# Patient Record
Sex: Female | Born: 1985
Health system: Southern US, Community
[De-identification: ages and names within clinical notes are randomized; demographics above are authoritative.]

## PROBLEM LIST (undated history)

## (undated) ENCOUNTER — Inpatient Hospital Stay (HOSPITAL_COMMUNITY): Payer: Self-pay

## (undated) DIAGNOSIS — M1990 Unspecified osteoarthritis, unspecified site: Secondary | ICD-10-CM

## (undated) DIAGNOSIS — N200 Calculus of kidney: Secondary | ICD-10-CM

## (undated) DIAGNOSIS — K649 Unspecified hemorrhoids: Secondary | ICD-10-CM

## (undated) DIAGNOSIS — C439 Malignant melanoma of skin, unspecified: Secondary | ICD-10-CM

## (undated) DIAGNOSIS — O139 Gestational [pregnancy-induced] hypertension without significant proteinuria, unspecified trimester: Secondary | ICD-10-CM

## (undated) DIAGNOSIS — D649 Anemia, unspecified: Secondary | ICD-10-CM

## (undated) DIAGNOSIS — I10 Essential (primary) hypertension: Secondary | ICD-10-CM

## (undated) DIAGNOSIS — M1180 Other specified crystal arthropathies, unspecified site: Secondary | ICD-10-CM

## (undated) DIAGNOSIS — R42 Dizziness and giddiness: Secondary | ICD-10-CM

## (undated) DIAGNOSIS — O1490 Unspecified pre-eclampsia, unspecified trimester: Secondary | ICD-10-CM

## (undated) DIAGNOSIS — N390 Urinary tract infection, site not specified: Secondary | ICD-10-CM

## (undated) HISTORY — DX: Calculus of kidney: N20.0

## (undated) HISTORY — PX: MOHS SURGERY: SUR867

## (undated) HISTORY — PX: WISDOM TOOTH EXTRACTION: SHX21

## (undated) HISTORY — DX: Dizziness and giddiness: R42

## (undated) HISTORY — DX: Malignant melanoma of skin, unspecified: C43.9

## (undated) HISTORY — DX: Essential (primary) hypertension: I10

## (undated) HISTORY — DX: Urinary tract infection, site not specified: N39.0

## (undated) HISTORY — DX: Unspecified hemorrhoids: K64.9

## (undated) HISTORY — DX: Unspecified osteoarthritis, unspecified site: M19.90

## (undated) HISTORY — DX: Unspecified pre-eclampsia, unspecified trimester: O14.90

## (undated) HISTORY — PX: NO PAST SURGERIES: SHX2092

## (undated) HISTORY — DX: Anemia, unspecified: D64.9

## (undated) HISTORY — DX: Other specified crystal arthropathies, unspecified site: M11.80

---

## 2009-08-03 ENCOUNTER — Encounter: Admission: RE | Admit: 2009-08-03 | Discharge: 2009-09-08 | Payer: Self-pay | Admitting: Obstetrics and Gynecology

## 2009-09-07 ENCOUNTER — Inpatient Hospital Stay (HOSPITAL_COMMUNITY): Admission: AD | Admit: 2009-09-07 | Discharge: 2009-09-07 | Payer: Self-pay | Admitting: Obstetrics and Gynecology

## 2009-09-07 ENCOUNTER — Other Ambulatory Visit: Payer: Self-pay | Admitting: Emergency Medicine

## 2009-10-10 ENCOUNTER — Inpatient Hospital Stay (HOSPITAL_COMMUNITY): Admission: AD | Admit: 2009-10-10 | Discharge: 2009-10-13 | Payer: Self-pay | Admitting: Obstetrics and Gynecology

## 2009-10-11 DIAGNOSIS — O133 Gestational [pregnancy-induced] hypertension without significant proteinuria, third trimester: Secondary | ICD-10-CM

## 2009-10-14 ENCOUNTER — Encounter: Admission: RE | Admit: 2009-10-14 | Discharge: 2009-11-10 | Payer: Self-pay | Admitting: Obstetrics and Gynecology

## 2009-12-14 ENCOUNTER — Ambulatory Visit: Payer: Self-pay | Admitting: Family Medicine

## 2009-12-14 DIAGNOSIS — J029 Acute pharyngitis, unspecified: Secondary | ICD-10-CM

## 2009-12-14 DIAGNOSIS — J069 Acute upper respiratory infection, unspecified: Secondary | ICD-10-CM | POA: Insufficient documentation

## 2009-12-15 ENCOUNTER — Encounter: Payer: Self-pay | Admitting: Family Medicine

## 2010-06-13 ENCOUNTER — Ambulatory Visit: Payer: Self-pay | Admitting: Emergency Medicine

## 2010-10-11 NOTE — Letter (Signed)
Summary: Out of Work  [facility]  [address]   [place], [place] [address]   Phone: [phone]  Fax: [phone]    December 14, 2009   Employee:  Diana Fischer    To Whom It May Concern:   Diana Fischer was evaluated in our clinic today.  If you need additional information, please feel free to contact our office.         Sincerely,    Donna Christen MD

## 2010-10-11 NOTE — Assessment & Plan Note (Signed)
Summary: possible sinus infection/fever/sore throat   Vital Signs:  Patient Profile:   25 Years Old Female CC:      runny nose, sore thorat, sinus drainage and pressure X 3 days LMP:     01/08/2009 Height:     64 inches Weight:      161 pounds O2 treatment:    Room Air Temp:     99.0 degrees F oral Pulse rate:   96 / minute Resp:     16 per minute (right arm)  Pt. in pain?   no  Vitals Entered By: Lajean Saver RN (December 14, 2009 11:51 AM)  Menstrual History: LMP (date): 01/08/2009 LMP - Reliable: approximate (month known)                   Updated Prior Medication List: MULTIVITAMINS  CAPS (MULTIPLE VITAMIN) once daily ZYRTEC-D ALLERGY & CONGESTION 5-120 MG XR12H-TAB (CETIRIZINE-PSEUDOEPHEDRINE)  DIPHENHYDRAMINE HCL 25 MG CAPS (DIPHENHYDRAMINE HCL)   Current Allergies: ! * SEASONALHistory of Present Illness Chief Complaint: runny nose, sore thorat, sinus drainage and pressure X 3 days History of Present Illness: Subjective: Patient complains of onset of nasal congestion 4 days ago followed by sore throat and cough.  The sore throat has persisted.  She has a history of seasonal allergies.   No pleuritic pain No wheezing + post-nasal drainage No sinus pain/pressure No itchy/red eyes ? earache No hemoptysis No SOB No fever, + chills No nausea No vomiting No abdominal pain No diarrhea No skin rashes + fatigue No myalgias + headache Used OTC meds without relief   REVIEW OF SYSTEMS Constitutional Symptoms      Denies fever, chills, night sweats, weight loss, weight gain, and fatigue.  Eyes       Denies change in vision, eye pain, eye discharge, glasses, contact lenses, and eye surgery. Ear/Nose/Throat/Mouth       Complains of frequent runny nose, sinus problems, and sore throat.      Denies hearing loss/aids, change in hearing, ear pain, ear discharge, dizziness, frequent nose bleeds, hoarseness, and tooth pain or bleeding.  Respiratory       Denies dry  cough, productive cough, wheezing, shortness of breath, asthma, bronchitis, and emphysema/COPD.  Cardiovascular       Denies murmurs, chest pain, and tires easily with exhertion.    Gastrointestinal       Denies stomach pain, nausea/vomiting, diarrhea, constipation, blood in bowel movements, and indigestion. Genitourniary       Denies painful urination, kidney stones, and loss of urinary control. Neurological       Complains of headaches.      Denies paralysis, seizures, and fainting/blackouts. Musculoskeletal       Denies muscle pain, joint pain, joint stiffness, decreased range of motion, redness, swelling, muscle weakness, and gout.  Skin       Denies bruising, unusual mles/lumps or sores, and hair/skin or nail changes.  Psych       Denies mood changes, temper/anger issues, anxiety/stress, speech problems, depression, and sleep problems. Other Comments: Symptoms started Sunday after a trip to the park. She has taken Zyrtec D and Benadryl   Past History:  Past Medical History: Gestational Diabetes Gestational Hypertension 3 months post partum  Past Surgical History: Denies surgical history  Family History: Family History Hypertension- mother  Social History: 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 Alcohol use-no Drug use-no Regular exercise-yes Smoking Status:  never Drug Use:  no Does Patient Exercise:  yes   Objective:  Appearance:  Patient appears healthy, stated age, and in no acute distress  Eyes:  Pupils are equal, round, and reactive to light and accomdation.  Extraocular movement is intact.  Conjunctivae are not inflamed.  Ears:  Canals normal.  Tympanic membranes normal.   Nose:  Normal septum.  Normal turbinates, mildly congested.    No sinus tenderness present.  Pharynx:  Mildly erythematous Neck:  Supple.  No adenopathy is present.   Lungs:  Clear to auscultation.  Breath sounds are equal.  Heart:  Regular rate and rhythm without murmurs, rubs, or  gallops.  Abdomen:  Nontender without masses or hepatosplenomegaly.  Bowel sounds are present.  No CVA or flank tenderness.  Rapid strep test negative                                                                                                                                                                                                                                                                                                                                                                                                     Assessment New Problems: URI (ICD-465.9) ACUTE PHARYNGITIS (ICD-462)  SUSPECT VIRAL URI  Plan New Medications/Changes: AMOXICILLIN 875 MG TABS (AMOXICILLIN) One by mouth two times a day.  Rx void after 12/24/2009.  #20 x 0, 12/14/2009, Donna Christen MD BENZONATATE 200 MG CAPS (BENZONATATE) One by mouth hs as needed cough  #12 x 0, 12/14/2009, Donna Christen MD  New Orders: New Patient Level III [99203] Rapid Strep [87880] T-Culture, Throat [[address]] Planning Comments:   Throat culture pending. expectorant/decongestant, cough suppressant at night Begin  amoxicillin if culture positive or develops persistent facial pain or fever.       Follow-up with PCP if not improving  The patient  and/or caregiver has been counseled thoroughly with regard to medications prescribed including dosage, schedule, interactions, rationale for use, and possible side effects and they verbalize understanding.  Diagnoses and expected course of recovery discussed and will return if not improved as expected or if the condition worsens. Patient and/or caregiver verbalized understanding.  Prescriptions: AMOXICILLIN 875 MG TABS (AMOXICILLIN) One by mouth two times a day.  Rx void after 12/24/2009.  #20 x 0   Entered and Authorized by:   Donna Christen MD   Signed by:   Donna Christen MD on 12/14/2009   Method used:   Print then Give to Patient   RxID:   [id number] BENZONATATE 200 MG CAPS (BENZONATATE) One by mouth hs as needed cough  #12 x 0   Entered and Authorized by:   Donna Christen MD   Signed by:   Donna Christen MD on 12/14/2009   Method used:   Print then Give to Patient   RxID:   [id number]   Patient Instructions: 1)  Stop Benadryl and ZyrtecD for now and take Mucinex D (guaifenesin with decongestant) twice daily for congestion. 2)  Increase fluid intake, rest. 3)  May use Afrin nasal spray (or generic oxymetazoline) twice daily for about 5 days.  Also recommend using saline nasal spray several times daily and/or saline nasal irrigation. 4)  Add amoxicillin if throat culture positive or if persistent fever, facilal pain, etc, develops in about 5 days 5)  Followup with family doctor if not improving 10 to 14 days.  Appended Document: possible sinus infection/fever/sore throat Rapid strep: Negative

## 2010-10-11 NOTE — Letter (Signed)
Summary: Handout Printed  Printed Handout:  - Rheumatic Fever 

## 2010-10-11 NOTE — Assessment & Plan Note (Signed)
Summary: COLD,COUGH/WB   Vital Signs:  Patient Profile:   25 Years Old Female CC:      sore throat, productive cough, sinus pressure Height:     64 inches Weight:      158 pounds O2 Sat:      99 % O2 treatment:    Room Air Temp:     98.8 degrees F oral Pulse rate:   79 / minute Resp:     14 per minute BP sitting:   138 / 86  (left arm) Cuff size:   regular  Vitals Entered By: Lajean Saver RN (June 13, 2010 5:24 PM)                  Updated Prior Medication List: MULTIVITAMINS  CAPS (MULTIPLE VITAMIN) once daily LOESTRIN 24 FE 1-20 MG-MCG TABS (NORETHIN ACE-ETH ESTRAD-FE)   Current Allergies (reviewed today): ! * SEASONALHistory of Present Illness Chief Complaint: sore throat, productive cough, sinus pressure History of Present Illness: Patient complains of onset of cold symptoms for 6 days.  They have been using OTC sudafed PE which is helping a little bit. + sore throat + nighttime cough No pleuritic pain No wheezing + nasal congestion No post-nasal drainage + sinus pain/pressure No itchy/red eyes No earache No hemoptysis No SOB No chills/sweats No fever No nausea No vomiting No abdominal pain No diarrhea No skin rashes No fatigue No myalgias No headache   REVIEW OF SYSTEMS Constitutional Symptoms      Denies fever, chills, night sweats, weight loss, weight gain, and fatigue.  Eyes       Denies change in vision, eye pain, eye discharge, glasses, contact lenses, and eye surgery. Ear/Nose/Throat/Mouth       Complains of sinus problems, sore throat, and hoarseness.      Denies hearing loss/aids, change in hearing, ear pain, ear discharge, dizziness, frequent runny nose, frequent nose bleeds, and tooth pain or bleeding.  Respiratory       Complains of productive cough.      Denies dry cough, wheezing, shortness of breath, asthma, bronchitis, and emphysema/COPD.  Cardiovascular       Denies murmurs, chest pain, and tires easily with exhertion.     Gastrointestinal       Denies stomach pain, nausea/vomiting, diarrhea, constipation, blood in bowel movements, and indigestion. Genitourniary       Denies painful urination, kidney stones, and loss of urinary control. Neurological       Complains of headaches.      Denies paralysis, seizures, and fainting/blackouts. Musculoskeletal       Denies muscle pain, joint pain, joint stiffness, decreased range of motion, redness, swelling, muscle weakness, and gout.  Skin       Denies bruising, unusual mles/lumps or sores, and hair/skin or nail changes.  Psych       Denies mood changes, temper/anger issues, anxiety/stress, speech problems, depression, and sleep problems.  Past History:  Past Medical History: Gestational Diabetes Gestational Hypertension  Past Surgical History: Reviewed history from 12/14/2009 and no changes required. Denies surgical history  Family History: Reviewed history from 12/14/2009 and no changes required. Family History Hypertension- mother  Social History: Reviewed history from 12/14/2009 and no changes required. 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 Alcohol use-no Drug use-no Regular exercise-yes Physical Exam General appearance: well developed, well nourished, no acute distress Head: normocephalic, atraumatic Ears: normal, no lesions or deformities Nasal: mucosa pink, nonedematous, no septal deviation, turbinates normal Oral/Pharynx: pharyngeal erythema without exudate, uvula  midline without deviation Chest/Lungs: no rales, wheezes, or rhonchi bilateral, breath sounds equal without effort Heart: regular rate and  rhythm, no murmur Skin: no obvious rashes or lesions MSE: oriented to time, place, and person Assessment New Problems: UPPER RESPIRATORY INFECTION, ACUTE (ICD-465.9)   Patient Education: Patient and/or caregiver instructed in the following: rest, fluids, Ibuprofen prn.  Plan New Medications/Changes: AMOXICILLIN 875 MG TABS  (AMOXICILLIN) 1 tab by mouth two times a day for 10 days  #20 x 0, 06/13/2010, Hoyt Koch MD  New Orders: Rapid Strep [04540] Est. Patient Level III [99213] Planning Comments:   Continue your OTC cough and cold meds, consider Sudafed 12-hour instead of Sudafed PE Likely viral, but if not better in a few days, can start and then complete the antibiotics   The patient and/or caregiver has been counseled thoroughly with regard to medications prescribed including dosage, schedule, interactions, rationale for use, and possible side effects and they verbalize understanding.  Diagnoses and expected course of recovery discussed and will return if not improved as expected or if the condition worsens. Patient and/or caregiver verbalized understanding.  Prescriptions: AMOXICILLIN 875 MG TABS (AMOXICILLIN) 1 tab by mouth two times a day for 10 days  #20 x 0   Entered and Authorized by:   Hoyt Koch MD   Signed by:   Hoyt Koch MD on 06/13/2010   Method used:   Print then Give to Patient   RxID:   [id number]   Orders Added: 1)  Rapid Strep [08657] 2)  Est. Patient Level III [84696]  Appended Document: COLD,COUGH/WB Rapid Strep:Negative

## 2010-11-04 ENCOUNTER — Ambulatory Visit (INDEPENDENT_AMBULATORY_CARE_PROVIDER_SITE_OTHER): Payer: Self-pay | Admitting: Emergency Medicine

## 2010-11-04 ENCOUNTER — Encounter: Payer: Self-pay | Admitting: Emergency Medicine

## 2010-11-04 DIAGNOSIS — J069 Acute upper respiratory infection, unspecified: Secondary | ICD-10-CM

## 2010-11-08 NOTE — Assessment & Plan Note (Signed)
Summary: SINUS PROBLEMS/TJ   Vital Signs:  Patient Profile:   25 Years Old Female CC:      sinus pain, prod cough, left ear pain x 2 weeks Height:     64 inches Weight:      161 pounds O2 Sat:      98 % O2 treatment:    Room Air Temp:     99.2 degrees F oral Pulse rate:   92 / minute Resp:     16 per minute BP sitting:   152 / 89  (left arm) Cuff size:   regular  Vitals Entered By: Lajean Saver RN (November 04, 2010 5:06 PM)                  Updated Prior Medication List: MULTIVITAMINS  CAPS (MULTIPLE VITAMIN) once daily LOESTRIN 24 FE 1-20 MG-MCG TABS (NORETHIN ACE-ETH ESTRAD-FE)   Current Allergies (reviewed today): ! * SEASONALHistory of Present Illness Chief Complaint: sinus pain, prod cough, left ear pain x 2 weeks History of Present Illness: 25 Years Old Female complains of onset of cold symptoms for 2 weeks. Makaylee has been using Zyrtec-D which is helping a little bit.  Her husband has been sick for 3 weeks. + sore throat + cough No pleuritic pain No wheezing + nasal congestion + post-nasal drainage + sinus pain/pressure No chest congestion No itchy/red eyes + L earache No hemoptysis No SOB No chills/sweats No fever No nausea No vomiting No abdominal pain No diarrhea No skin rashes No fatigue No myalgias No headache   REVIEW OF SYSTEMS Constitutional Symptoms       Complains of fatigue.     Denies fever, chills, night sweats, weight loss, and weight gain.  Eyes       Denies change in vision, eye pain, eye discharge, glasses, contact lenses, and eye surgery. Ear/Nose/Throat/Mouth       Complains of ear pain, frequent runny nose, and sinus problems.      Denies hearing loss/aids, change in hearing, ear discharge, dizziness, frequent nose bleeds, sore throat, hoarseness, and tooth pain or bleeding.  Respiratory       Complains of productive cough.      Denies dry cough, wheezing, shortness of breath, asthma, bronchitis, and emphysema/COPD.    Cardiovascular       Denies murmurs, chest pain, and tires easily with exhertion.    Gastrointestinal       Denies stomach pain, nausea/vomiting, diarrhea, constipation, blood in bowel movements, and indigestion. Genitourniary       Denies painful urination, kidney stones, and loss of urinary control. Neurological       Denies paralysis, seizures, and fainting/blackouts. Musculoskeletal       Denies muscle pain, joint pain, joint stiffness, decreased range of motion, redness, swelling, muscle weakness, and gout.  Skin       Denies bruising, unusual mles/lumps or sores, and hair/skin or nail changes.  Psych       Denies mood changes, temper/anger issues, anxiety/stress, speech problems, depression, and sleep problems.  Past History:  Past Medical History: Reviewed history from 06/13/2010 and no changes required. Gestational Diabetes Gestational Hypertension  Past Surgical History: Reviewed history from 12/14/2009 and no changes required. Denies surgical history  Family History: Reviewed history from 12/14/2009 and no changes required. Family History Hypertension- mother  Social History: Reviewed history from 12/14/2009 and no changes required. 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 Alcohol use-no Drug use-no Regular exercise-yes Physical Exam General appearance: well developed, well  nourished, no acute distress Ears: normal, no lesions or deformities Nasal: mucosa pink, nonedematous, no septal deviation, turbinates normal Oral/Pharynx: tongue normal, posterior pharynx without erythema or exudate Chest/Lungs: no rales, wheezes, or rhonchi bilateral, breath sounds equal without effort Heart: regular rate and  rhythm, no murmur MSE: oriented to time, place, and person  Plan New Medications/Changes: AMOXICILLIN 875 MG TABS (AMOXICILLIN) 1 by mouth two times a day for 10 days  #20 x 0, 11/04/2010, Hoyt Koch MD  New Orders: Est. Patient Level IV  [[address]] Planning Comments:   1)  Take the prescribed antibiotic as instructed. 2)  Use nasal saline solution (over the counter) at least 3 times a day. 3)  Use over the counter decongestants like Zyrtec-D every 12 hours as needed to help with congestion. 4)  Can take tylenol every 6 hours or motrin every 8 hours for pain or fever. 5)  Follow up with your primary doctor  if no improvement in 5-7 days, sooner if increasing pain, fever, or new symptoms.    The patient and/or caregiver has been counseled thoroughly with regard to medications prescribed including dosage, schedule, interactions, rationale for use, and possible side effects and they verbalize understanding.  Diagnoses and expected course of recovery discussed and will return if not improved as expected or if the condition worsens. Patient and/or caregiver verbalized understanding.  Prescriptions: AMOXICILLIN 875 MG TABS (AMOXICILLIN) 1 by mouth two times a day for 10 days  #20 x 0   Entered and Authorized by:   Hoyt Koch MD   Signed by:   Hoyt Koch MD on 11/04/2010   Method used:   Print then Give to Patient   RxID:   [id number]   Orders Added: 1)  Est. Patient Level IV [95621]

## 2010-11-28 LAB — COMPREHENSIVE METABOLIC PANEL
ALT: 14 U/L (ref 0–35)
Albumin: 2.5 g/dL — ABNORMAL LOW (ref 3.5–5.2)
Alkaline Phosphatase: 146 U/L — ABNORMAL HIGH (ref 39–117)
Potassium: 3.6 mEq/L (ref 3.5–5.1)
Sodium: 140 mEq/L (ref 135–145)
Total Protein: 5.9 g/dL — ABNORMAL LOW (ref 6.0–8.3)

## 2010-11-28 LAB — CBC
Hemoglobin: 12.1 g/dL (ref 12.0–15.0)
MCV: 93.8 fL (ref 78.0–100.0)
Platelets: 240 10*3/uL (ref 150–400)
Platelets: 260 10*3/uL (ref 150–400)
RDW: 13.5 % (ref 11.5–15.5)
WBC: 13.2 10*3/uL — ABNORMAL HIGH (ref 4.0–10.5)
WBC: 14.1 10*3/uL — ABNORMAL HIGH (ref 4.0–10.5)

## 2010-11-28 LAB — RPR: RPR Ser Ql: NONREACTIVE

## 2010-12-01 LAB — CBC
HCT: 27.4 % — ABNORMAL LOW (ref 36.0–46.0)
Hemoglobin: 9.4 g/dL — ABNORMAL LOW (ref 12.0–15.0)
MCHC: 34.2 g/dL (ref 30.0–36.0)
MCV: 94.7 fL (ref 78.0–100.0)
RDW: 13.7 % (ref 11.5–15.5)

## 2010-12-12 LAB — CBC
Hemoglobin: 11.8 g/dL — ABNORMAL LOW (ref 12.0–15.0)
MCHC: 34.2 g/dL (ref 30.0–36.0)
MCV: 94 fL (ref 78.0–100.0)
RBC: 3.67 MIL/uL — ABNORMAL LOW (ref 3.87–5.11)
WBC: 12.1 10*3/uL — ABNORMAL HIGH (ref 4.0–10.5)

## 2010-12-12 LAB — COMPREHENSIVE METABOLIC PANEL
ALT: 18 U/L (ref 0–35)
CO2: 25 mEq/L (ref 19–32)
Calcium: 8.8 mg/dL (ref 8.4–10.5)
Chloride: 105 mEq/L (ref 96–112)
Creatinine, Ser: 0.47 mg/dL (ref 0.4–1.2)
GFR calc non Af Amer: >60 mL/min (ref >60)
Glucose, Bld: 82 mg/dL (ref 70–99)
Total Bilirubin: 0.3 mg/dL (ref 0.3–1.2)

## 2010-12-12 LAB — LACTATE DEHYDROGENASE: LDH: 150 U/L (ref 94–250)

## 2011-02-23 ENCOUNTER — Inpatient Hospital Stay (INDEPENDENT_AMBULATORY_CARE_PROVIDER_SITE_OTHER)
Admission: RE | Admit: 2011-02-23 | Discharge: 2011-02-23 | Disposition: A | Discharge status: Home / Self Care | Payer: 59 | Source: Ambulatory Visit | Attending: Emergency Medicine | Admitting: Emergency Medicine

## 2011-02-23 ENCOUNTER — Encounter: Payer: Self-pay | Admitting: Emergency Medicine

## 2011-02-23 DIAGNOSIS — R109 Unspecified abdominal pain: Secondary | ICD-10-CM

## 2011-02-23 LAB — CONVERTED CEMR LAB
Beta hcg, urine, semiquantitative: NEGATIVE
Bilirubin Urine: NEGATIVE
Blood in Urine, dipstick: NEGATIVE
Glucose, Urine, Semiquant: NEGATIVE
Ketones, urine, test strip: NEGATIVE
Nitrite: NEGATIVE
Protein, U semiquant: NEGATIVE
Specific Gravity, Urine: 1.025
Urobilinogen, UA: 0.2
WBC Urine, dipstick: NEGATIVE
pH: 5.5

## 2011-02-28 ENCOUNTER — Telehealth (INDEPENDENT_AMBULATORY_CARE_PROVIDER_SITE_OTHER): Payer: Self-pay | Admitting: Emergency Medicine

## 2011-08-14 NOTE — Progress Notes (Signed)
Summary: LIGHT HEADED/NAUSEA/PELVIC CRAMPS   Vital Signs:  Patient Profile:   25 Years Old Female CC:      pelvic cramping, lower back pain, nausea x 3 days, htn this AM LMP:     02/09/2011 Height:     64 inches Weight:      160 pounds O2 Sat:      99 % O2 treatment:    Room Air Temp:     98.9 degrees F oral Pulse rate:   74 / minute Resp:     14 per minute BP sitting:   136 / 93  (left arm) Cuff size:   regular  Vitals Entered By: Lajean Saver RN (February 23, 2011 10:49 AM)  Menstrual History: LMP (date): 02/09/2011                  Updated Prior Medication List: MULTIVITAMINS  CAPS (MULTIPLE VITAMIN) once daily ZYRTEC ALLERGY 10 MG TABS (CETIRIZINE HCL)   Current Allergies (reviewed today): ! * SEASONALHistory of Present Illness History from: patient Chief Complaint: pelvic cramping, lower back pain, nausea x 3 days, htn this AM History of Present Illness: Pt complains of pelvic pain. Location:suprapubic  Onset:3 days ago  Description/Quality of Pain: crampy, feels like "midcycle pain" Intensity of pain: 4/10 Modifying Factors: Has not tried pain med. Trauma: None Symptoms Worse with:n/a Better with: nothing LMP was wnl 02/09/11. Associated sxs: mild low back pain without radiation. Thick, clear vag d/c which she states is normal for her midcycle. Mild nausea, no vomiting or diarrhea or change in bowel habits. No dysuria. She is monogomous with husband and denies any chance of STD, and declines pelvic exam or std testing. She was at work this am when the pelvic cramping increased, and she briefly became lightheaded, "saw spots" for 2 seconds, but that resolved. No syncope or focal neuro sxs. Her BP was checked at work, was 146/93. Hx of pregnancy induced HTN, which resolved when she delivered 16 months ago. Denies chest pain, palpitations, dyspnea. Had moderate headache which is easing up now.  REVIEW OF SYSTEMS Constitutional Symptoms      Denies fever, chills,  night sweats, weight loss, weight gain, and fatigue.  Eyes       Denies change in vision, eye pain, eye discharge, glasses, contact lenses, and eye surgery. Ear/Nose/Throat/Mouth       Denies hearing loss/aids, change in hearing, ear pain, ear discharge, dizziness, frequent runny nose, frequent nose bleeds, sinus problems, sore throat, hoarseness, and tooth pain or bleeding.  Respiratory       Denies dry cough, productive cough, wheezing, shortness of breath, asthma, bronchitis, and emphysema/COPD.  Cardiovascular       Denies murmurs, chest pain, and tires easily with exhertion.    Gastrointestinal       Complains of nausea/vomiting.      Denies stomach pain, diarrhea, constipation, blood in bowel movements, and indigestion.      Comments: nausea only Genitourniary       Denies painful urination, blood or discharge from vagina, kidney stones, and loss of urinary control. Neurological       Complains of headaches.      Denies paralysis, seizures, and fainting/blackouts. Musculoskeletal       Denies muscle pain, joint pain, joint stiffness, decreased range of motion, redness, swelling, muscle weakness, and gout.      Comments: lower back pain Skin       Denies bruising, unusual mles/lumps or sores, and hair/skin or nail  changes.  Psych       Denies mood changes, temper/anger issues, anxiety/stress, speech problems, depression, and sleep problems. Other Comments: Patient c/o nausea, pelvic cramping, lower back pain and slight increase in discharge x 3 days. This AM she became ligthheaded and "saw spots", upon taking her BP it was 146/93. It has since dropped to 136/93. She does not have a PCP. [facility] card given.    Past History:  Past Medical History: Reviewed history from 06/13/2010 and no changes required. Gestational Diabetes Gestational Hypertension  Past Surgical History: Reviewed history from 12/14/2009 and no changes required. Denies surgical history  Family  History: Reviewed history from 12/14/2009 and no changes required. Family History Hypertension- mother  Social History: Reviewed history from 12/14/2009 and no changes required. 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 Alcohol use-no Drug use-no Regular exercise-yes Physical Exam General appearance: well developed, well nourished, no acute distress. Mildly fatigued. Not toxic-appearing. Head: normocephalic, atraumatic Eyes: conjunctivae and lids normal Pupils: equal, round, reactive to light Ears: normal, no lesions or deformities Nasal: mucosa pink, nonedematous, no septal deviation, turbinates normal. Minimal seous nasal drainage. Oral/Pharynx: tongue normal, posterior pharynx without erythema or exudate Neck: neck supple,  trachea midline, no masses. Carotids nl without bruits. Thyroid: no nodules, masses, tenderness, or enlargement Chest/Lungs: no rales, wheezes, or rhonchi bilateral, breath sounds equal without effort Heart: regular rate and  rhythm, no murmur Abdomen: soft, mild suprapubic tenderness. without obvious organomegaly or masses.No flank ttp. No guarding or rebound. Normoactive BS's x 4 GU: pt declined pelvic or rectal exam Extremities: normal extremities Neurological: grossly intact and non-focal. Normal gait. Back: no cvat Skin: no obvious rashes or lesions MSE: oriented to time, place, and person BP repeated by me: 120/78 R arm Assessment New Problems: PELVIC  PAIN (ICD-789.09)  Likely midcycle pelvic pain. UA and urine pregnancy tests today: Negative. CBC: WNL. Hgb normal. Absolute granulocyte count WNL. Clinically, no evidence of acute abdomen. BP may have been elevated this am from acute pelvic pain. No evidence of any acute cardioresp or neurological event.  Plan New Orders: Est. Patient Level IV [04540] Urine Pregnancy Test  [81025] UA Dipstick w/o Micro (automated)  [81003] T-CBC w/Diff [[id number]] Planning Comments:   Pt declined any  further testing. Declined any pain rx. Addvised to take otc ibuprofen, 200mg , 3 by mouth three times a day pc. Risks, benefits, alternatives discussed. Pt voiced understanding and agreement.  Follow Up: with her ob-gyn if no better in 2-3 days, sooner if worse or new sxs. Go to ER stat if any severe, worsening sxs.---Also, advised to establish with a family physician, info and card given Work Excuse: excused from work today  The patient and/or caregiver has been counseled thoroughly with regard to medications prescribed including dosage, schedule, interactions, rationale for use, and possible side effects and they verbalize understanding.  Diagnoses and expected course of recovery discussed and will return if not improved as expected or if the condition worsens. Patient and/or caregiver verbalized understanding.   Orders Added: 1)  Est. Patient Level IV [95621] 2)  Urine Pregnancy Test  [81025] 3)  UA Dipstick w/o Micro (automated)  [81003] 4)  T-CBC w/Diff [[id number]]    Laboratory Results   Urine Tests  Date/Time Received: February 23, 2011 11:18 AM  Date/Time Reported: February 23, 2011 11:18 AM   Routine Urinalysis   Color: yellow Appearance: Clear Glucose: negative   (Normal Range: Negative) Bilirubin: negative   (Normal Range: Negative) Ketone: negative   (  Normal Range: Negative) Spec. Gravity: 1.025   (Normal Range: 1.003-1.035) Blood: negative   (Normal Range: Negative) pH: 5.5   (Normal Range: 5.0-8.0) Protein: negative   (Normal Range: Negative) Urobilinogen: 0.2   (Normal Range: 0-1) Nitrite: negative   (Normal Range: Negative) Leukocyte Esterace: negative   (Normal Range: Negative)    Urine HCG: negative

## 2011-08-14 NOTE — Telephone Encounter (Signed)
  Phone Note Outgoing Call Call back at [facility] Phone [phone]   Call placed by: Emilio Math,  February 28, 2011 9:50 AM Call placed to: Patient Summary of Call: left msg, call with any questions or concerns

## 2012-05-05 ENCOUNTER — Emergency Department
Admission: EM | Admit: 2012-05-05 | Discharge: 2012-05-05 | Disposition: A | Discharge status: Home / Self Care | Payer: Self-pay | Source: Home / Self Care

## 2012-05-05 DIAGNOSIS — S61209A Unspecified open wound of unspecified finger without damage to nail, initial encounter: Secondary | ICD-10-CM

## 2012-05-05 DIAGNOSIS — S61219A Laceration without foreign body of unspecified finger without damage to nail, initial encounter: Secondary | ICD-10-CM

## 2012-05-05 NOTE — ED Provider Notes (Signed)
History     CSN: [id number]  Arrival date & time 05/05/12  1640   First MD Initiated Contact with Patient 05/05/12 1746      No chief complaint on file.  HPI Comments: Has remained with full ROM, finger flexion and extension.  Neurovascularly intact distally.   Patient is a 26 y.o. female presenting with skin laceration.  Laceration  The incident occurred less than 1 hour ago (pt cut her hand accidentally with hedge clippers ). Pain location: R 3rd finger  Injury mechanism: hedge clippers  The pain is mild. She reports no foreign bodies present. Her tetanus status is UTD.    No past medical history on file.  No past surgical history on file.  No family history on file.  History  Substance Use Topics  . Smoking status: Not on file  . Smokeless tobacco: Not on file  . Alcohol Use: Not on file    OB History    No data available      Review of Systems  All other systems reviewed and are negative.    Allergies  Review of patient's allergies indicates not on file.  Home Medications  No current outpatient prescriptions on file.  There were no vitals taken for this visit.  Physical Exam  Constitutional: She appears well-developed and well-nourished.  HENT:  Head: Normocephalic and atraumatic.  Eyes: Conjunctivae are normal. Pupils are equal, round, and reactive to light.  Neck: Normal range of motion. Neck supple.  Cardiovascular: Normal rate and regular rhythm.   Pulmonary/Chest: Effort normal and breath sounds normal.  Abdominal: Soft.  Musculoskeletal: Normal range of motion.       Hands:         Neurological: She is alert.  Skin:       2 1.5 cm longitudinal lacerations on palmar aspect of R 3rd finger.  Midline laceration does proceed approx 0.25 cm into cuticle.      ED Course  Procedures  Laceration Repair: Area soaked in chlorhexidine then manually cleansed with sterile water. Area then sterilized with topical betadine and alcohol swabs. 2%  lidocaine applied to affected area. Three sutures placed in interrupted fashion (5-0 ethilon). dermabond applied to most medial laceration.  Cuticle trimmed over affected area. No subungual involvement. Area cleansed. Topical antibiotic ointment and dressing applied.  Labs Reviewed - No data to display No results found.   1. Finger laceration       MDM  Sutures and dermabond applied to affected areas Discussed general care and infectious red flags.  Would consider wound culture and trial of augmentin vs. Doxy  Plan for follow up in 5-7 days for suture removal.      The patient and/or caregiver has been counseled thoroughly with regard to treatment plan and/or medications prescribed including dosage, schedule, interactions, rationale for use, and possible side effects and they verbalize understanding. Diagnoses and expected course of recovery discussed and will return if not improved as expected or if the condition worsens. Patient and/or caregiver verbalized understanding.              Doree Albee, MD 05/05/12 [phone]

## 2012-06-10 ENCOUNTER — Encounter: Payer: Self-pay | Admitting: *Deleted

## 2012-06-10 ENCOUNTER — Emergency Department
Admission: EM | Admit: 2012-06-10 | Discharge: 2012-06-10 | Disposition: A | Discharge status: Home / Self Care | Payer: Self-pay | Source: Home / Self Care

## 2012-06-10 ENCOUNTER — Other Ambulatory Visit: Payer: Self-pay | Admitting: Family Medicine

## 2012-06-10 ENCOUNTER — Ambulatory Visit (HOSPITAL_BASED_OUTPATIENT_CLINIC_OR_DEPARTMENT_OTHER)
Admission: RE | Admit: 2012-06-10 | Discharge: 2012-06-10 | Disposition: A | Discharge status: Home / Self Care | Payer: Self-pay | Source: Ambulatory Visit | Attending: Family Medicine | Admitting: Family Medicine

## 2012-06-10 DIAGNOSIS — N83209 Unspecified ovarian cyst, unspecified side: Secondary | ICD-10-CM | POA: Insufficient documentation

## 2012-06-10 DIAGNOSIS — R109 Unspecified abdominal pain: Secondary | ICD-10-CM | POA: Insufficient documentation

## 2012-06-10 DIAGNOSIS — R350 Frequency of micturition: Secondary | ICD-10-CM | POA: Insufficient documentation

## 2012-06-10 DIAGNOSIS — R110 Nausea: Secondary | ICD-10-CM | POA: Insufficient documentation

## 2012-06-10 DIAGNOSIS — N949 Unspecified condition associated with female genital organs and menstrual cycle: Secondary | ICD-10-CM

## 2012-06-10 DIAGNOSIS — R102 Pelvic and perineal pain: Secondary | ICD-10-CM

## 2012-06-10 HISTORY — DX: Gestational (pregnancy-induced) hypertension without significant proteinuria, unspecified trimester: O13.9

## 2012-06-10 LAB — POCT URINALYSIS DIP (MANUAL ENTRY)
Bilirubin, UA: NEGATIVE
Glucose, UA: NEGATIVE
Nitrite, UA: NEGATIVE
Spec Grav, UA: 1.015 (ref 1.005–1.03)
pH, UA: 5.5 (ref 5–8)

## 2012-06-10 LAB — COMPREHENSIVE METABOLIC PANEL
Albumin: 4.7 g/dL (ref 3.5–5.2)
BUN: 7 mg/dL (ref 6–23)
CO2: 25 mEq/L (ref 19–32)
Calcium: 9.7 mg/dL (ref 8.4–10.5)
Chloride: 104 mEq/L (ref 96–112)
Glucose, Bld: 83 mg/dL (ref 70–99)
Potassium: 4.5 mEq/L (ref 3.5–5.3)
Sodium: 139 mEq/L (ref 135–145)
Total Protein: 7.6 g/dL (ref 6.0–8.3)

## 2012-06-10 MED ORDER — IOHEXOL 300 MG/ML  SOLN
100.0000 mL | Freq: Once | INTRAMUSCULAR | Status: AC | PRN
  Administered 2012-06-10: 100 mL via INTRAVENOUS

## 2012-06-10 NOTE — ED Notes (Signed)
Pt c/o LT sided pelvic pain and urinary frequency x 2 days. She also c/o hypertension.

## 2012-06-10 NOTE — ED Provider Notes (Addendum)
History     CSN: [id number]  Arrival date & time 06/10/12  0915   First MD Initiated Contact with Patient 06/10/12 [phone]      Chief Complaint  Patient presents with  . Pelvic Pain  . Hypertension   HPI Comments: Pt states that she started her cycle last week. Had otherwise normal cycle on 1st and 2nd day. Then developed severe LLQ pain as well as cessation of menstrual cycle over the weekend.  Pt states that pain persisted through the weekend into today.  Pt states that pain was 8-9/10 over the weekend and has mildly improved as of today.  Pt states that this has never happened before. Pain has been predominantly L sided without radiation.  Pt states that she has had some increased urinary frequency associated with abdominal pain. No dysuria. No hx/o UTIs. No hx/o kidney stones.  No fevers or chills.  Pt is sexually active with her husband. Is currently not on birth control. No prior hx/o STDs. No vaginal discharge apart from now intermittent spotting.  Pt does report a hx/o gestational HTN with her last pregnancy 3 years ago. Pt states that she has been off of HTN medication since end of pregnancy. Pt states that she    Patient is a 26 y.o. female presenting with pelvic pain and hypertension.  Pelvic Pain This is a new problem. Episode onset: 2-3 days  Progression since onset: initially worsened now somewhat milder.   Hypertension    Past Medical History  Diagnosis Date  . Gestational diabetes   . Gestational hypertension     History reviewed. No pertinent past surgical history.  Family History  Problem Relation Age of Onset  . Hypertension Mother     History  Substance Use Topics  . Smoking status: Never Smoker   . Smokeless tobacco: Not on file  . Alcohol Use: No    OB History    Grav Para Term Preterm Abortions TAB SAB Ect Mult Living                  Review of Systems  Genitourinary: Positive for pelvic pain.  All other systems reviewed and are  negative.    Allergies  Review of patient's allergies indicates no known allergies.  Home Medications  No current outpatient prescriptions on file.  BP 134/95  Pulse 77  Temp 98.6 F (37 C) (Oral)  Resp 18  Ht 5' 3.5" (1.613 m)  Wt 176 lb (79.833 kg)  BMI 30.69 kg/m2  SpO2 99%  LMP 06/06/2012  Physical Exam  Vitals reviewed. Constitutional: She appears well-developed and well-nourished.  HENT:  Head: Normocephalic and atraumatic.  Eyes: Conjunctivae normal are normal. Pupils are equal, round, and reactive to light.  Neck: Normal range of motion. Neck supple.  Cardiovascular: Normal rate, regular rhythm and normal heart sounds.   Pulmonary/Chest: Effort normal and breath sounds normal.  Abdominal: Soft. Bowel sounds are normal.       + RLQ and LLQ pain  Pain 7-8/10 on deep palpation bilaterally   Musculoskeletal: Normal range of motion.  Neurological: She is alert.  Skin: Skin is warm.    ED Course  Procedures (including critical care time)   Labs Reviewed  POCT URINALYSIS DIP (MANUAL ENTRY)  POCT URINE PREGNANCY  POCT CBC W AUTO DIFF ([facility])  COMPREHENSIVE METABOLIC PANEL  URINE CULTURE   No results found.   1. Pelvic pain in female       MDM  Relatively  broad differential for pain including GYN/GU/GI pathologies.  Given RLQ pain, will set up pt for CT Abd and Pelvis with IV and oral contrast to r/o appendicitis. This should also give good imaging for ? Ovarian cyst as well as fibroids.  Upreg negative.  UA with moderate blood. Will culture.  Noted WBC 10.8 on CBC today.  Discussed general reasoning for lab work and imaging.  Pt is overall stable currently without any clear clinical indications for ER transfer.  Will follow pending imaging.           Doree Albee, MD 06/10/12 1051  Doree Albee, MD 06/11/12 [phone]

## 2012-06-11 LAB — POCT CBC W AUTO DIFF (K'VILLE URGENT CARE)

## 2012-06-12 LAB — URINE CULTURE: Colony Count: 40000

## 2012-06-13 ENCOUNTER — Telehealth: Payer: Self-pay | Admitting: *Deleted

## 2012-06-18 ENCOUNTER — Encounter: Payer: Self-pay | Admitting: Obstetrics & Gynecology

## 2013-04-24 LAB — OB RESULTS CONSOLE ANTIBODY SCREEN: Antibody Screen: NEGATIVE

## 2013-04-24 LAB — OB RESULTS CONSOLE RPR: RPR: NONREACTIVE

## 2013-04-24 LAB — OB RESULTS CONSOLE GC/CHLAMYDIA
Chlamydia: NEGATIVE
Gonorrhea: NEGATIVE

## 2013-04-24 LAB — OB RESULTS CONSOLE HEPATITIS B SURFACE ANTIGEN: HEP B S AG: NEGATIVE

## 2013-04-24 LAB — OB RESULTS CONSOLE RUBELLA ANTIBODY, IGM: Rubella: IMMUNE

## 2013-04-24 LAB — OB RESULTS CONSOLE ABO/RH: RH TYPE: POSITIVE

## 2013-04-24 LAB — OB RESULTS CONSOLE HIV ANTIBODY (ROUTINE TESTING): HIV: NONREACTIVE

## 2013-08-09 ENCOUNTER — Encounter: Payer: Self-pay | Admitting: Emergency Medicine

## 2013-08-09 ENCOUNTER — Emergency Department
Admission: EM | Admit: 2013-08-09 | Discharge: 2013-08-09 | Disposition: A | Discharge status: Home / Self Care | Payer: 59 | Source: Home / Self Care

## 2013-08-09 DIAGNOSIS — J069 Acute upper respiratory infection, unspecified: Secondary | ICD-10-CM

## 2013-08-09 MED ORDER — AMOXICILLIN 875 MG PO TABS: 875.0000 mg | ORAL_TABLET | Freq: Two times a day (BID) | ORAL | Status: DC

## 2013-08-09 NOTE — ED Notes (Signed)
Patient c/o sinus pressure, sore throat, nasal congestion and head ache x 3-4 days. Patient is currently pregnant and has tried OTC Tylenol sinus and cold am/pm with some relief.

## 2013-08-09 NOTE — ED Notes (Signed)
Symptoms started one week ago with head and chest congestion, c/o difficulty sleeping

## 2013-08-09 NOTE — ED Provider Notes (Signed)
CSN: [id number]     Arrival date & time 08/09/13  1005 History   None    Chief Complaint  Patient presents with  . Sinusitis    HPI  URI Symptoms Onset: 2-3 days  Description: rhinorrhea, nasal congestion, cough  Modifying factors:  6 months pregnant. Previous dx of gestational DM and HTN with her last pregnancy. Has normal pregnancy this gestation per pt.   Symptoms Nasal discharge: yes Fever: no Sore throat: no Cough: yes Wheezing: no Ear pain: no GI symptoms: no Sick contacts: no  Red Flags  Stiff neck: no Dyspnea: no Rash: no Swallowing difficulty: no  Sinusitis Risk Factors Headache/face pain: no Double sickening: no tooth pain: no  Allergy Risk Factors Sneezing: no Itchy scratchy throat: no Seasonal symptoms: no  Flu Risk Factors Headache: no muscle aches: no severe fatigue: no    Past Medical History  Diagnosis Date  . Gestational diabetes   . Gestational hypertension    History reviewed. No pertinent past surgical history. Family History  Problem Relation Age of Onset  . Hypertension Mother    History  Substance Use Topics  . Smoking status: Never Smoker   . Smokeless tobacco: Not on file  . Alcohol Use: No   OB History   Grav Para Term Preterm Abortions TAB SAB Ect Mult Living   1              Review of Systems  All other systems reviewed and are negative.    Allergies  Review of patient's allergies indicates no known allergies.  Home Medications   Current Outpatient Rx  Name  Route  Sig  Dispense  Refill  . amoxicillin (AMOXIL) 875 MG tablet   Oral   Take 1 tablet (875 mg total) by mouth 2 (two) times daily.   20 tablet   0    BP 115/78  Pulse 85  Temp(Src) 98.5 F (36.9 C) (Oral)  Resp 16  Ht 5' 3.5" (1.613 m)  Wt 183 lb (83.008 kg)  BMI 31.90 kg/m2  SpO2 99% Physical Exam  Constitutional: She appears well-developed and well-nourished.  HENT:  Head: Normocephalic and atraumatic.  Eyes: Conjunctivae are  normal. Pupils are equal, round, and reactive to light.  Neck: Normal range of motion. Neck supple.  Cardiovascular: Normal rate and regular rhythm.   Pulmonary/Chest: Effort normal.  Abdominal: Soft.  Gravid abdomen    Musculoskeletal: Normal range of motion.  Neurological: She is alert.  Skin: Skin is warm.    ED Course  Procedures (including critical care time) Labs Review Labs Reviewed - No data to display Imaging Review No results found.  EKG Interpretation    Date/Time:    Ventricular Rate:    PR Interval:    QRS Duration:   QT Interval:    QTC Calculation:   R Axis:     Text Interpretation:              MDM   1. URI (upper respiratory infection)    Suspect likely viral source of sxs Discussed supportive care and infectious/resp red flags Also discussed OB red flags.  PPx rx for amox if worsen/fail to improve over next 7-10 days.  Follow up as needed.     Doree Albee, MD 08/09/13 [phone]

## 2013-09-11 NOTE — L&D Delivery Note (Signed)
Delivery Note  SVD viable female Apgars 9,9 over 2nd degree ml lac.  Placenta delivered spontaneously intact with 3VC. Repair with 2-0 Chromic with good support and hemostasis noted and R/V exam confirms.  PH art was sent.  [place] cord blood was not done.  Mother and baby were doing well.  EBL 300cc  Louretta Shorten, MD

## 2013-10-21 ENCOUNTER — Encounter: Payer: Self-pay | Admitting: Emergency Medicine

## 2013-10-21 ENCOUNTER — Emergency Department
Admission: EM | Admit: 2013-10-21 | Discharge: 2013-10-21 | Disposition: A | Discharge status: Home / Self Care | Payer: 59 | Source: Home / Self Care | Attending: Emergency Medicine | Admitting: Emergency Medicine

## 2013-10-21 DIAGNOSIS — J029 Acute pharyngitis, unspecified: Secondary | ICD-10-CM

## 2013-10-21 LAB — POCT RAPID STREP A (OFFICE): Rapid Strep A Screen: NEGATIVE

## 2013-10-21 MED ORDER — AMOXICILLIN 875 MG PO TABS: ORAL_TABLET | ORAL | Status: DC

## 2013-10-21 NOTE — ED Notes (Signed)
Pt c/o sore throat radiating to her RT ear x 3 days. Denies fever. She reports a family member with strep recently.

## 2013-10-21 NOTE — ED Provider Notes (Signed)
CSN: [id number]     Arrival date & time 10/21/13  1255 History   First MD Initiated Contact with Patient 10/21/13 1308     Chief Complaint  Patient presents with  . Sore Throat  . Otalgia     (Consider location/radiation/quality/duration/timing/severity/associated sxs/prior Treatment) HPI SORE THROAT Onset: 3 days    Severity: moderate-severe Tried OTC meds without significant relief.  Symptoms:  + Fever  + Swollen neck glands No Recent Strep Exposure     No Myalgias No Headache No Rash  No Discolored Nasal Mucus No Allergy symptoms No sinus pain/pressure No itchy/red eyes Positive mild bilateral earache  No Drooling No Trismus  No Nausea No Vomiting No Abdominal pain No Diarrhea No Reflux symptoms  No Cough No Breathing Difficulty No Shortness of Breath No pleuritic pain No Wheezing No Hemoptysis  Positive for mild fatigue   Past Medical History  Diagnosis Date  . Gestational diabetes   . Gestational hypertension    History reviewed. No pertinent past surgical history. Family History  Problem Relation Age of Onset  . Hypertension Mother    History  Substance Use Topics  . Smoking status: Never Smoker   . Smokeless tobacco: Not on file  . Alcohol Use: No   OB History   Grav Para Term Preterm Abortions TAB SAB Ect Mult Living   1              Review of Systems  All other systems reviewed and are negative.      Allergies  Review of patient's allergies indicates no known allergies.  Home Medications   Current Outpatient Rx  Name  Route  Sig  Dispense  Refill  . Prenatal Vit-Fe Fumarate-FA (PRENATAL MULTIVITAMIN) TABS tablet   Oral   Take 1 tablet by mouth daily at 12 noon.         Marland Kitchen amoxicillin (AMOXIL) 875 MG tablet      Take 1 twice a day X 10 days.   20 tablet   0    BP 121/87  Pulse 97  Temp(Src) 98.6 F (37 C) (Oral)  Resp 18  Wt 189 lb (85.73 kg)  SpO2 98% Physical Exam  Nursing note and vitals  reviewed. Constitutional: She is oriented to person, place, and time. She appears well-developed and well-nourished. She is cooperative.  Non-toxic appearance. No distress.  HENT:  Head: Normocephalic and atraumatic.  Right Ear: Tympanic membrane, external ear and ear canal normal.  Left Ear: Tympanic membrane, external ear and ear canal normal.  Nose: Nose normal. Right sinus exhibits no maxillary sinus tenderness and no frontal sinus tenderness. Left sinus exhibits no maxillary sinus tenderness and no frontal sinus tenderness.  Mouth/Throat: Mucous membranes are normal. Posterior oropharyngeal erythema present. No oropharyngeal exudate or posterior oropharyngeal edema.  Eyes: Conjunctivae are normal. No scleral icterus.  Neck: Neck supple.  Cardiovascular: Normal rate, regular rhythm and normal heart sounds.   No murmur heard. Pulmonary/Chest: Effort normal and breath sounds normal. No stridor. No respiratory distress. She has no wheezes. She has no rales.  Musculoskeletal: She exhibits no edema.  Lymphadenopathy:    She has cervical adenopathy.       Right cervical: Superficial cervical adenopathy present. No deep cervical and no posterior cervical adenopathy present.      Left cervical: Superficial cervical adenopathy present. No deep cervical and no posterior cervical adenopathy present.  Neurological: She is alert and oriented to person, place, and time.  Skin: Skin is warm and  dry.  Psychiatric: She has a normal mood and affect.    ED Course  Procedures (including critical care time) Labs Review Labs Reviewed  STREP A DNA PROBE  POCT RAPID STREP A (OFFICE)   Imaging Review No results found.    MDM   Final diagnoses:  Acute pharyngitis    Rapid strep test negative. She has added risk factor of close contact with someone diagnosed with positive strep test. Treatment options discussed, as well as risks, benefits, alternatives. Patient voiced understanding and agreement  with the following plans: Amoxicillin 875 twice a day x10 days Other symptomatic care discussed Sendoff strep culture Followup with PCP if no better one week, sooner if worse or new symptoms Precautions discussed. Red flags discussed. Questions invited and answered. Patient voiced understanding and agreement.      Jacqulyn Cane, MD 10/21/13 1500

## 2013-10-22 LAB — STREP A DNA PROBE: GASP: NEGATIVE

## 2013-10-31 ENCOUNTER — Encounter (HOSPITAL_COMMUNITY): Payer: Self-pay | Admitting: *Deleted

## 2013-10-31 ENCOUNTER — Inpatient Hospital Stay (HOSPITAL_COMMUNITY)
Admission: AD | Admit: 2013-10-31 | Discharge: 2013-10-31 | Disposition: A | Discharge status: Home / Self Care | Payer: 59 | Source: Ambulatory Visit | Attending: Obstetrics and Gynecology | Admitting: Obstetrics and Gynecology

## 2013-10-31 DIAGNOSIS — R030 Elevated blood-pressure reading, without diagnosis of hypertension: Secondary | ICD-10-CM | POA: Insufficient documentation

## 2013-10-31 DIAGNOSIS — IMO0001 Reserved for inherently not codable concepts without codable children: Secondary | ICD-10-CM

## 2013-10-31 DIAGNOSIS — M545 Low back pain, unspecified: Secondary | ICD-10-CM | POA: Insufficient documentation

## 2013-10-31 DIAGNOSIS — O9989 Other specified diseases and conditions complicating pregnancy, childbirth and the puerperium: Principal | ICD-10-CM

## 2013-10-31 DIAGNOSIS — O99891 Other specified diseases and conditions complicating pregnancy: Secondary | ICD-10-CM | POA: Insufficient documentation

## 2013-10-31 DIAGNOSIS — R51 Headache: Secondary | ICD-10-CM | POA: Insufficient documentation

## 2013-10-31 LAB — URINALYSIS, ROUTINE W REFLEX MICROSCOPIC
Bilirubin Urine: NEGATIVE
GLUCOSE, UA: NEGATIVE mg/dL
Hgb urine dipstick: NEGATIVE
KETONES UR: NEGATIVE mg/dL
LEUKOCYTES UA: NEGATIVE
Nitrite: NEGATIVE
PH: 7 (ref 5.0–8.0)
Protein, ur: NEGATIVE mg/dL
SPECIFIC GRAVITY, URINE: 1.01 (ref 1.005–1.030)
Urobilinogen, UA: 0.2 mg/dL (ref 0.0–1.0)

## 2013-10-31 MED ORDER — OXYCODONE-ACETAMINOPHEN 5-325 MG PO TABS
1.0000 | ORAL_TABLET | Freq: Once | ORAL | Status: AC
  Administered 2013-10-31: 1 via ORAL
  Filled 2013-10-31: qty 1

## 2013-10-31 NOTE — Discharge Instructions (Signed)
Preeclampsia and Eclampsia °Preeclampsia is a condition of high blood pressure during pregnancy. It can happen at 20 weeks or later in pregnancy. If high blood pressure occurs in the second half of pregnancy with no other symptoms, it is called gestational hypertension and goes away after the baby is born. If any of the symptoms listed below develop with gestational hypertension, it is then called preeclampsia. Eclampsia (convulsions) may follow preeclampsia. This is one of the reasons for regular prenatal checkups. Early diagnosis and treatment are very important to prevent eclampsia. °CAUSES  °There is no known cause of preeclampsia/eclampsia in pregnancy. There are several known conditions that may put the pregnant woman at risk, such as: °· The first pregnancy. °· Having preeclampsia in a past pregnancy. °· Having lasting (chronic) high blood pressure. °· Having multiples (twins, triplets). °· Being age 35 or older. °· African American ethnic background. °· Having kidney disease or diabetes. °· Medical conditions such as lupus or blood diseases. °· Being overweight (obese). °SYMPTOMS  °· High blood pressure. °· Headaches. °· Sudden weight gain. °· Swelling of hands, face, legs, and feet. °· Protein in the urine. °· Feeling sick to your stomach (nauseous) and throwing up (vomiting). °· Vision problems (blurred or double vision). °· Numbness in the face, arms, legs, and feet. °· Dizziness. °· Slurred speech. °· Preeclampsia can cause growth retardation in the fetus. °· Separation (abruption) of the placenta. °· Not enough fluid in the amniotic sac (oligohydramnios). °· Sensitivity to bright lights. °· Belly (abdominal) pain. °DIAGNOSIS  °If protein is found in the urine in the second half of pregnancy, this is considered preeclampsia. Other symptoms mentioned above may also be present. °TREATMENT  °It is necessary to treat this. °· Your caregiver may prescribe bed rest early in this condition. Plenty of rest and  salt restriction may be all that is needed. °· Medicines may be necessary to lower blood pressure if the condition does not respond to more conservative measures. °· In more severe cases, hospitalization may be needed: °· For treatment of blood pressure. °· To control fluid retention. °· To monitor the baby to see if the condition is causing harm to the baby. °· Hospitalization is the best way to treat the first sign of preeclampsia. This is so the mother and baby can be watched closely and blood tests can be done effectively and correctly. °· If the condition becomes severe, it may be necessary to induce labor or to remove the infant by surgical means (cesarean section). The best cure for preeclampsia/eclampsia is to deliver the baby. °Preeclampsia and eclampsia involve risks to mother and infant. Your caregiver will discuss these risks with you. Together, you can work out the best possible approach to your problems. Make sure you keep your prenatal visits as scheduled. Not keeping appointments could result in a chronic or permanent injury, pain, disability to you, and death or injury to you or your unborn baby. If there is any problem keeping the appointment, you must call to reschedule. °HOME CARE INSTRUCTIONS  °· Keep your prenatal appointments and tests as scheduled. °· Tell your caregiver if you have any of the above risk factors. °· Get plenty of rest and sleep. °· Eat a balanced diet that is low in salt, and do not add salt to your food. °· Avoid stressful situations. °· Only take over-the-counter and prescriptions medicines for pain, discomfort, or fever as directed by your caregiver. °SEEK IMMEDIATE MEDICAL CARE IF:  °· You develop severe swelling   anywhere in the body. This usually occurs in the legs. °· You gain 05 lb/2.3 kg or more in a week. °· You develop a severe headache, dizziness, problems with your vision, or confusion. °· You have abdominal pain, nausea, or vomiting. °· You have a seizure. °· You  have trouble moving any part of your body, or you develop numbness or problems speaking. °· You have bruising or abnormal bleeding from anywhere in the body. °· You develop a stiff neck. °· You pass out. °MAKE SURE YOU:  °· Understand these instructions. °· Will watch your condition. °· Will get help right away if you are not doing well or get worse. °Document Released: 08/25/2000 Document Revised: 11/20/2011 Document Reviewed: 04/10/2008 °ExitCare® Patient Information ©2014 [employer]. ° °

## 2013-10-31 NOTE — MAU Note (Signed)
Patient presents with complaints of increased blood pressure during pregnancy (history of same with last pregnancy and no blood pressure issues when not gravid) X a week and low back pain since last night.

## 2013-10-31 NOTE — MAU Provider Note (Signed)
History     CSN: [id number]  Arrival date and time: 10/31/13 1355   First Provider Initiated Contact with Patient 10/31/13 1436      Chief Complaint  Patient presents with  . Pre-Eclampsia  . Back Pain   HPI Diana Fischer is a 28 y.o. G2P1001 at [redacted]w[redacted]d who presents to MAU today with complaint of elevated BP and headache. The patient states that she has a history of gestation HTN and has had elevated BP in the office x 1 week. She has a headache now rated at 6/10. She last took Tylenol at 1300 today without relief. She is seeing floaters occasionally and endorses peripheral edema. She denies blurred vision or abdominal pain. She states occasional irregular contractions. She denies vaginal bleeding, discharge or LOF. She reports good fetal movement. Patient states labs were performed in the office yesterday and were normal.   OB History   Grav Para Term Preterm Abortions TAB SAB Ect Mult Living   2 1 1       1       Past Medical History  Diagnosis Date  . Gestational diabetes   . Gestational hypertension     Past Surgical History  Procedure Laterality Date  . No past surgeries      Family History  Problem Relation Age of Onset  . Hypertension Mother     History  Substance Use Topics  . Smoking status: Never Smoker   . Smokeless tobacco: Never Used  . Alcohol Use: No    Allergies: No Known Allergies  Prescriptions prior to admission  Medication Sig Dispense Refill  . amoxicillin (AMOXIL) 875 MG tablet Take 1 twice a day X 10 days.  20 tablet  0  . Prenatal Vit-Fe Fumarate-FA (PRENATAL MULTIVITAMIN) TABS tablet Take 1 tablet by mouth daily at 12 noon.        Review of Systems  Eyes: Negative for blurred vision and double vision.       + floaters  Gastrointestinal: Negative for abdominal pain.  Genitourinary:       Neg - vaginal bleeding, discharge, LOF  Neurological: Positive for headaches.   Physical Exam   Blood pressure 121/80, pulse 95,  temperature 98.7 F (37.1 C), temperature source Oral, resp. rate 18, height 5' 3.5" (1.613 m), weight 193 lb (87.544 kg), last menstrual period 02/26/2013.  Physical Exam  Constitutional: She is oriented to person, place, and time. She appears well-developed and well-nourished. No distress.  HENT:  Head: Normocephalic and atraumatic.  Cardiovascular: Normal rate, regular rhythm and normal heart sounds.   Respiratory: Effort normal and breath sounds normal. No respiratory distress.  GI: Soft. Bowel sounds are normal. She exhibits no distension and no mass. There is no tenderness. There is no rebound and no guarding.  Musculoskeletal: Normal range of motion. She exhibits no edema.  Neurological: She is alert and oriented to person, place, and time. She has normal reflexes.  No clonus  Skin: Skin is warm and dry. No erythema.  Psychiatric: She has a normal mood and affect.   Results for orders placed during the hospital encounter of 10/31/13 (from the past 24 hour(s))  URINALYSIS, ROUTINE W REFLEX MICROSCOPIC     Status: None   Collection Time    10/31/13  2:05 PM      Result Value Ref Range   Color, Urine YELLOW  YELLOW   APPearance CLEAR  CLEAR   Specific Gravity, Urine 1.010  1.005 - 1.030   pH  7.0  5.0 - 8.0   Glucose, UA NEGATIVE  NEGATIVE mg/dL   Hgb urine dipstick NEGATIVE  NEGATIVE   Bilirubin Urine NEGATIVE  NEGATIVE   Ketones, ur NEGATIVE  NEGATIVE mg/dL   Protein, ur NEGATIVE  NEGATIVE mg/dL   Urobilinogen, UA 0.2  0.0 - 1.0 mg/dL   Nitrite NEGATIVE  NEGATIVE   Leukocytes, UA NEGATIVE  NEGATIVE    MAU Course  Procedures None  MDM Discussed with Dr. Julien Girt. [place] for discharge. Follow-up next week as scheduled  Assessment and Plan  A: Elevated BP in pregnancy  P: Discharge home Warning signs for pre-eclampsia discussed Patient advised to keep appointment for Monday for follow-up as scheduled Patient may return to MAU as needed or if her condition were to change  or worsen  Farris Has, PA-C 10/31/2013, 2:36 PM

## 2013-11-13 ENCOUNTER — Inpatient Hospital Stay (HOSPITAL_COMMUNITY)
Admission: AD | Admit: 2013-11-13 | Discharge: 2013-11-16 | DRG: 775 | Disposition: A | Discharge status: Home / Self Care | Payer: 59 | Source: Ambulatory Visit | Attending: Obstetrics and Gynecology | Admitting: Obstetrics and Gynecology

## 2013-11-13 ENCOUNTER — Encounter (HOSPITAL_COMMUNITY): Payer: Self-pay | Admitting: *Deleted

## 2013-11-13 DIAGNOSIS — O1490 Unspecified pre-eclampsia, unspecified trimester: Secondary | ICD-10-CM

## 2013-11-13 DIAGNOSIS — Z22330 Carrier of Group B streptococcus: Secondary | ICD-10-CM

## 2013-11-13 DIAGNOSIS — O9989 Other specified diseases and conditions complicating pregnancy, childbirth and the puerperium: Secondary | ICD-10-CM

## 2013-11-13 DIAGNOSIS — O139 Gestational [pregnancy-induced] hypertension without significant proteinuria, unspecified trimester: Principal | ICD-10-CM | POA: Diagnosis present

## 2013-11-13 DIAGNOSIS — O99892 Other specified diseases and conditions complicating childbirth: Secondary | ICD-10-CM | POA: Diagnosis present

## 2013-11-13 HISTORY — DX: Unspecified pre-eclampsia, unspecified trimester: O14.90

## 2013-11-13 LAB — URINALYSIS, ROUTINE W REFLEX MICROSCOPIC
BILIRUBIN URINE: NEGATIVE
Glucose, UA: NEGATIVE mg/dL
Ketones, ur: 40 mg/dL — AB
NITRITE: NEGATIVE
PH: 7 (ref 5.0–8.0)
Protein, ur: NEGATIVE mg/dL
SPECIFIC GRAVITY, URINE: 1.01 (ref 1.005–1.030)
Urobilinogen, UA: 0.2 mg/dL (ref 0.0–1.0)

## 2013-11-13 LAB — RPR: RPR Ser Ql: NONREACTIVE

## 2013-11-13 LAB — COMPREHENSIVE METABOLIC PANEL
ALBUMIN: 2.7 g/dL — AB (ref 3.5–5.2)
ALK PHOS: 165 U/L — AB (ref 39–117)
ALT: 9 U/L (ref 0–35)
AST: 15 U/L (ref 0–37)
BILIRUBIN TOTAL: 0.3 mg/dL (ref 0.3–1.2)
BUN: 6 mg/dL (ref 6–23)
CO2: 23 mEq/L (ref 19–32)
CREATININE: 0.65 mg/dL (ref 0.50–1.10)
Calcium: 9 mg/dL (ref 8.4–10.5)
Chloride: 104 mEq/L (ref 96–112)
GFR calc non Af Amer: >90 mL/min (ref >90)
GLUCOSE: 77 mg/dL (ref 70–99)
Potassium: 3.7 mEq/L (ref 3.7–5.3)
Sodium: 141 mEq/L (ref 137–147)
TOTAL PROTEIN: 6.7 g/dL (ref 6.0–8.3)

## 2013-11-13 LAB — OB RESULTS CONSOLE GBS: GBS: POSITIVE

## 2013-11-13 LAB — URINE MICROSCOPIC-ADD ON

## 2013-11-13 LAB — HIV ANTIBODY (ROUTINE TESTING W REFLEX): HIV: NONREACTIVE

## 2013-11-13 LAB — CBC
HEMATOCRIT: 35.2 % — AB (ref 36.0–46.0)
HEMOGLOBIN: 12 g/dL (ref 12.0–15.0)
MCH: 30.9 pg (ref 26.0–34.0)
MCHC: 34.1 g/dL (ref 30.0–36.0)
MCV: 90.7 fL (ref 78.0–100.0)
Platelets: 235 10*3/uL (ref 150–400)
RBC: 3.88 MIL/uL (ref 3.87–5.11)
RDW: 13.3 % (ref 11.5–15.5)
WBC: 13.6 10*3/uL — ABNORMAL HIGH (ref 4.0–10.5)

## 2013-11-13 LAB — LACTATE DEHYDROGENASE: LDH: 175 U/L (ref 94–250)

## 2013-11-13 LAB — URIC ACID: Uric Acid, Serum: 4.7 mg/dL (ref 2.4–7.0)

## 2013-11-13 MED ORDER — LACTATED RINGERS IV SOLN
INTRAVENOUS | Status: DC
  Administered 2013-11-13 – 2013-11-14 (×3): via INTRAVENOUS

## 2013-11-13 MED ORDER — OXYTOCIN BOLUS FROM INFUSION: 500.0000 mL | INTRAVENOUS | Status: DC

## 2013-11-13 MED ORDER — ZOLPIDEM TARTRATE 5 MG PO TABS
5.0000 mg | ORAL_TABLET | Freq: Every evening | ORAL | Status: DC | PRN
  Administered 2013-11-13: 5 mg via ORAL
  Filled 2013-11-13: qty 1

## 2013-11-13 MED ORDER — LIDOCAINE HCL (PF) 1 % IJ SOLN
30.0000 mL | INTRAMUSCULAR | Status: DC | PRN
  Filled 2013-11-13: qty 30

## 2013-11-13 MED ORDER — LACTATED RINGERS IV SOLN: 500.0000 mL | INTRAVENOUS | Status: DC | PRN

## 2013-11-13 MED ORDER — IBUPROFEN 600 MG PO TABS
600.0000 mg | ORAL_TABLET | Freq: Four times a day (QID) | ORAL | Status: DC | PRN
Start: 2013-11-13 — End: 2013-11-14

## 2013-11-13 MED ORDER — ACETAMINOPHEN 325 MG PO TABS: 650.0000 mg | ORAL_TABLET | ORAL | Status: DC | PRN

## 2013-11-13 MED ORDER — DEXTROSE 5 % IV SOLN
2.5000 10*6.[IU] | INTRAVENOUS | Status: DC
  Filled 2013-11-13 (×3): qty 2.5

## 2013-11-13 MED ORDER — PENICILLIN G POTASSIUM 5000000 UNITS IJ SOLR
5.0000 10*6.[IU] | Freq: Once | INTRAMUSCULAR | Status: DC
  Filled 2013-11-13: qty 5

## 2013-11-13 MED ORDER — OXYCODONE-ACETAMINOPHEN 5-325 MG PO TABS: 1.0000 | ORAL_TABLET | ORAL | Status: DC | PRN

## 2013-11-13 MED ORDER — OXYTOCIN 40 UNITS IN LACTATED RINGERS INFUSION - SIMPLE MED
62.5000 mL/h | INTRAVENOUS | Status: DC
Start: 2013-11-13 — End: 2013-11-14
  Filled 2013-11-13: qty 1000

## 2013-11-13 MED ORDER — ACETAMINOPHEN 500 MG PO TABS
1000.0000 mg | ORAL_TABLET | Freq: Once | ORAL | Status: AC
  Administered 2013-11-13: 1000 mg via ORAL
  Filled 2013-11-13: qty 2

## 2013-11-13 MED ORDER — CITRIC ACID-SODIUM CITRATE 334-500 MG/5ML PO SOLN: 30.0000 mL | ORAL | Status: DC | PRN

## 2013-11-13 MED ORDER — TERBUTALINE SULFATE 1 MG/ML IJ SOLN: 0.2500 mg | Freq: Once | INTRAMUSCULAR | Status: AC | PRN

## 2013-11-13 MED ORDER — MISOPROSTOL 25 MCG QUARTER TABLET
25.0000 ug | ORAL_TABLET | Freq: Four times a day (QID) | ORAL | Status: DC
  Administered 2013-11-13 – 2013-11-14 (×3): 25 ug via VAGINAL
  Filled 2013-11-13: qty 1
  Filled 2013-11-13 (×2): qty 0.25
  Filled 2013-11-13: qty 1
  Filled 2013-11-13: qty 0.25
  Filled 2013-11-13 (×2): qty 1

## 2013-11-13 MED ORDER — FLEET ENEMA 7-19 GM/118ML RE ENEM: 1.0000 | ENEMA | RECTAL | Status: DC | PRN

## 2013-11-13 MED ORDER — ONDANSETRON HCL 4 MG/2ML IJ SOLN
4.0000 mg | Freq: Four times a day (QID) | INTRAMUSCULAR | Status: DC | PRN
  Administered 2013-11-14: 4 mg via INTRAVENOUS
  Filled 2013-11-13: qty 2

## 2013-11-13 NOTE — H&P (Signed)
Diana Fischer, Diana Fischer            ACCOUNT NO.:  [id number]  MEDICAL RECORD NO.:  [id number]  LOCATION:  ER74                          FACILITY:  [facility]  PHYSICIAN:  Ralene Bathe. Matthew Saras, M.D.DATE OF BIRTH:  10-20-1985  DATE OF ADMISSION:  11/13/2013 DATE OF DISCHARGE:                             HISTORY & PHYSICAL   CHIEF COMPLAINT:  PIH at 37 weeks.  HISTORY OF PRESENT ILLNESS:  A 28 year old, G2, P1, at 12 and 1 weeks, this patient has been off work during home BP monitoring.  She did have [place] with her first pregnancy.  Last week, BP here was noted to be 138/98, today 138/98, complaining of headache.  Urine negative for protein.  Ultrasound in our office, vertex, EFW 7 pounds 4 ounces, AFI was normal.  She has been doing home BP monitoring and states that her diastolics have been in the upper 90s at home.  At MAU, [place] labs drawn today which were normal.  NST was reactive.  She did receive 2 extra strong Tylenol and her headache went away.  After further discussion with Dr. Gaetano Net, because of her home BP and headache complaints, decision made to proceed with admission for two-stage induction, noted to be GBS positive.  PAST MEDICAL HISTORY:  Please see the [facility] form for details.  Last pregnancy in 2011, vaginal delivery 7 pounds 5 ounces, induction secondary to [place].  PHYSICAL EXAMINATION:  VITAL SIGNS:  Temp 98.2, blood pressure 138/98. HEENT:  Unremarkable. NECK:  Supple without masses. LUNGS:  Clear. CARDIOVASCULAR:  Regular rate and rhythm without murmurs, rubs, or gallops. BREASTS:  Not examined.  Term fundal height.  Fetal heart rate 140. CERVIX:  Closed. EXTREMITIES/NEUROLOGIC:  Unremarkable, except for 1+ edema.  Reflexes 1 to 2+, no clonus.  IMPRESSION:  A 37-1-week gestation, pregnancy-induced hypertension, complaints of headache with persistent diastolic elevations at home despite bed rest.  PLAN:  Two-stage labor induction scheduled per Dr.  Gaetano Net.     Joel Cowin M. Matthew Saras, M.D.    [facility]  D:  11/13/2013  T:  11/13/2013  Job:  [id number]

## 2013-11-13 NOTE — MAU Note (Signed)
Pt was sent from Dr. Delanna Ahmadi office today for [facility] evaluation.

## 2013-11-13 NOTE — H&P (Signed)
Diana Fischer  DICTATION # [id number] CSN# [id number]   Margarette Asal, MD 11/13/2013 2:10 PM

## 2013-11-13 NOTE — MAU Provider Note (Signed)
History     CSN: [id number]  Arrival date and time: 11/13/13 1148   First Provider Initiated Contact with Patient 11/13/13 1223      Chief Complaint  Patient presents with  . Headache   HPI [name] is a 28 y.o. G2P1001 at [redacted]w[redacted]d who presents to MAU today from the office for further evaluation of elevated BP. The patient states a history of GHTN with her last pregnancy. She states headache today. She denies blurred vision, abdominal pain, contractions, vaginal bleeding or LOF. She is having mild LE edema and reports good fetal movement.   OB History   Grav Para Term Preterm Abortions TAB SAB Ect Mult Living   2 1 1       1       Past Medical History  Diagnosis Date  . Gestational diabetes   . Gestational hypertension     Past Surgical History  Procedure Laterality Date  . No past surgeries      Family History  Problem Relation Age of Onset  . Hypertension Mother     History  Substance Use Topics  . Smoking status: Never Smoker   . Smokeless tobacco: Never Used  . Alcohol Use: No    Allergies: No Known Allergies  Prescriptions prior to admission  Medication Sig Dispense Refill  . acetaminophen (TYLENOL) 325 MG tablet Take 650 mg by mouth as needed for moderate pain or headache.      . calcium carbonate (TUMS - DOSED IN MG ELEMENTAL CALCIUM) 500 MG chewable tablet Chew 2-3 tablets by mouth 2 (two) times daily.      . Prenatal Vit-Fe Fumarate-FA (PRENATAL MULTIVITAMIN) TABS tablet Take 1 tablet by mouth daily at 12 noon.        Review of Systems  Eyes: Negative for blurred vision and double vision.  Gastrointestinal: Negative for abdominal pain.  Genitourinary:       Neg - vaginal bleeding, discharge  Neurological: Positive for headaches.   Physical Exam   Blood pressure 136/88, pulse 84, temperature 98.5 F (36.9 C), temperature source Oral, resp. rate 18, height 5' 5.5" (1.664 m), weight 194 lb 9.6 oz (88.27 kg), last menstrual period  02/26/2013, SpO2 98.00%.  Physical Exam  Constitutional: She is oriented to person, place, and time. She appears well-developed and well-nourished. No distress.  HENT:  Head: Normocephalic and atraumatic.  Cardiovascular: Normal rate.   Respiratory: Effort normal.  GI: Soft. She exhibits no distension and no mass. There is no tenderness. There is no rebound and no guarding.  Musculoskeletal: She exhibits edema (1+ pitting edema to the mid shin bilaterally).  Neurological: She is alert and oriented to person, place, and time. She has normal reflexes.  No clonus  Skin: Skin is warm and dry. No erythema.  Psychiatric: She has a normal mood and affect.   Results for orders placed during the hospital encounter of 11/13/13 (from the past 24 hour(s))  URINALYSIS, ROUTINE W REFLEX MICROSCOPIC     Status: Abnormal   Collection Time    11/13/13 12:05 PM      Result Value Ref Range   Color, Urine YELLOW  YELLOW   APPearance HAZY (*) CLEAR   Specific Gravity, Urine 1.010  1.005 - 1.030   pH 7.0  5.0 - 8.0   Glucose, UA NEGATIVE  NEGATIVE mg/dL   Hgb urine dipstick SMALL (*) NEGATIVE   Bilirubin Urine NEGATIVE  NEGATIVE   Ketones, ur 40 (*) NEGATIVE mg/dL  Protein, ur NEGATIVE  NEGATIVE mg/dL   Urobilinogen, UA 0.2  0.0 - 1.0 mg/dL   Nitrite NEGATIVE  NEGATIVE   Leukocytes, UA MODERATE (*) NEGATIVE  URINE MICROSCOPIC-ADD ON     Status: Abnormal   Collection Time    11/13/13 12:05 PM      Result Value Ref Range   Squamous Epithelial / LPF MANY (*) RARE   WBC, UA 3-6  <3 WBC/hpf   RBC / HPF 0-2  <3 RBC/hpf   Bacteria, UA FEW (*) RARE  CBC     Status: Abnormal   Collection Time    11/13/13  1:04 PM      Result Value Ref Range   WBC 13.6 (*) 4.0 - 10.5 K/uL   RBC 3.88  3.87 - 5.11 MIL/uL   Hemoglobin 12.0  12.0 - 15.0 g/dL   HCT 35.2 (*) 36.0 - 46.0 %   MCV 90.7  78.0 - 100.0 fL   MCH 30.9  26.0 - 34.0 pg   MCHC 34.1  30.0 - 36.0 g/dL   RDW 13.3  11.5 - 15.5 %   Platelets 235  150  - 400 K/uL  COMPREHENSIVE METABOLIC PANEL     Status: Abnormal   Collection Time    11/13/13  1:04 PM      Result Value Ref Range   Sodium 141  137 - 147 mEq/L   Potassium 3.7  3.7 - 5.3 mEq/L   Chloride 104  96 - 112 mEq/L   CO2 23  19 - 32 mEq/L   Glucose, Bld 77  70 - 99 mg/dL   BUN 6  6 - 23 mg/dL   Creatinine, Ser 0.65  0.50 - 1.10 mg/dL   Calcium 9.0  8.4 - 10.5 mg/dL   Total Protein 6.7  6.0 - 8.3 g/dL   Albumin 2.7 (*) 3.5 - 5.2 g/dL   AST 15  0 - 37 U/L   ALT 9  0 - 35 U/L   Alkaline Phosphatase 165 (*) 39 - 117 U/L   Total Bilirubin 0.3  0.3 - 1.2 mg/dL   GFR calc non Af Amer >90  >90 mL/min   GFR calc Af Amer >90  >90 mL/min  URIC ACID     Status: None   Collection Time    11/13/13  1:04 PM      Result Value Ref Range   Uric Acid, Serum 4.7  2.4 - 7.0 mg/dL  LACTATE DEHYDROGENASE     Status: None   Collection Time    11/13/13  1:04 PM      Result Value Ref Range   LDH 175  94 - 250 U/L    Patient Vitals for the past 24 hrs:  BP Temp Temp src Pulse Resp SpO2 Height Weight  11/13/13 1348 136/88 mmHg - - 84 - - - -  11/13/13 1333 138/91 mmHg - - 89 - - - -  11/13/13 1318 139/92 mmHg - - 88 - - - -  11/13/13 1303 143/97 mmHg - - 90 - - - -  11/13/13 1248 143/89 mmHg - - 93 - - - -  11/13/13 1233 124/71 mmHg - - 86 - - - -  11/13/13 1218 140/99 mmHg 98.5 F (36.9 C) Oral 92 18 98 % - -  11/13/13 1204 - - - - - - 5' 5.5" (1.664 m) 194 lb 9.6 oz (88.27 kg)    Fetal Monitoring: Baseline: 140 bpm, moderate variability, + accelerations,  no decelerations Contractions: none  MAU Course  Procedures None  MDM Discussed with Dr. Matthew Saras. Order labs and serial BPs. Tylenol for headache.  Patient reports resolution of headache symptoms Labs and vitals discussed with Dr. Matthew Saras. Admit for induction of labor for GHTN  Assessment and Plan  A: Gestational HTN  P: Admit for induction of labor  Farris Has, PA-C  11/13/2013, 2:36 PM

## 2013-11-14 ENCOUNTER — Encounter (HOSPITAL_COMMUNITY): Payer: Self-pay | Admitting: *Deleted

## 2013-11-14 ENCOUNTER — Inpatient Hospital Stay (HOSPITAL_COMMUNITY): Payer: 59 | Admitting: Anesthesiology

## 2013-11-14 ENCOUNTER — Encounter (HOSPITAL_COMMUNITY): Payer: 59 | Admitting: Anesthesiology

## 2013-11-14 LAB — CBC
HCT: 35.1 % — ABNORMAL LOW (ref 36.0–46.0)
HEMATOCRIT: 34.4 % — AB (ref 36.0–46.0)
Hemoglobin: 11.9 g/dL — ABNORMAL LOW (ref 12.0–15.0)
Hemoglobin: 11.7 g/dL — ABNORMAL LOW (ref 12.0–15.0)
MCH: 30.7 pg (ref 26.0–34.0)
MCH: 31 pg (ref 26.0–34.0)
MCHC: 33.9 g/dL (ref 30.0–36.0)
MCHC: 34 g/dL (ref 30.0–36.0)
MCV: 90.5 fL (ref 78.0–100.0)
MCV: 91.2 fL (ref 78.0–100.0)
PLATELETS: 228 10*3/uL (ref 150–400)
Platelets: 231 10*3/uL (ref 150–400)
RBC: 3.88 MIL/uL (ref 3.87–5.11)
RBC: 3.77 MIL/uL — ABNORMAL LOW (ref 3.87–5.11)
RDW: 13.3 % (ref 11.5–15.5)
RDW: 13.2 % (ref 11.5–15.5)
WBC: 14.3 10*3/uL — ABNORMAL HIGH (ref 4.0–10.5)
WBC: 19.5 10*3/uL — AB (ref 4.0–10.5)

## 2013-11-14 MED ORDER — EPHEDRINE 5 MG/ML INJ
10.0000 mg | INTRAVENOUS | Status: DC | PRN
  Filled 2013-11-14: qty 2

## 2013-11-14 MED ORDER — PENICILLIN G POTASSIUM 5000000 UNITS IJ SOLR
5.0000 10*6.[IU] | Freq: Once | INTRAMUSCULAR | Status: AC
  Administered 2013-11-14: 5 10*6.[IU] via INTRAVENOUS
  Filled 2013-11-14: qty 5

## 2013-11-14 MED ORDER — DIBUCAINE 1 % RE OINT
1.0000 "application " | TOPICAL_OINTMENT | RECTAL | Status: DC | PRN
  Administered 2013-11-14: 1 via RECTAL
  Filled 2013-11-14: qty 28

## 2013-11-14 MED ORDER — FENTANYL 2.5 MCG/ML BUPIVACAINE 1/10 % EPIDURAL INFUSION (WH - ANES)
INTRAMUSCULAR | Status: AC
  Administered 2013-11-14: 14 mL/h via EPIDURAL
  Filled 2013-11-14: qty 125

## 2013-11-14 MED ORDER — TETANUS-DIPHTH-ACELL PERTUSSIS 5-2.5-18.5 LF-MCG/0.5 IM SUSP: 0.5000 mL | Freq: Once | INTRAMUSCULAR | Status: DC

## 2013-11-14 MED ORDER — SODIUM BICARBONATE 8.4 % IV SOLN
INTRAVENOUS | Status: DC | PRN
  Administered 2013-11-14: 5 mL via EPIDURAL

## 2013-11-14 MED ORDER — TERBUTALINE SULFATE 1 MG/ML IJ SOLN: 0.2500 mg | Freq: Once | INTRAMUSCULAR | Status: DC | PRN

## 2013-11-14 MED ORDER — FENTANYL 2.5 MCG/ML BUPIVACAINE 1/10 % EPIDURAL INFUSION (WH - ANES): 14.0000 mL/h | INTRAMUSCULAR | Status: DC | PRN

## 2013-11-14 MED ORDER — OXYCODONE-ACETAMINOPHEN 5-325 MG PO TABS
1.0000 | ORAL_TABLET | ORAL | Status: DC | PRN
  Administered 2013-11-14 – 2013-11-16 (×11): 1 via ORAL
  Filled 2013-11-14 (×7): qty 1
  Filled 2013-11-14: qty 2
  Filled 2013-11-14 (×3): qty 1

## 2013-11-14 MED ORDER — LACTATED RINGERS IV SOLN
500.0000 mL | Freq: Once | INTRAVENOUS | Status: DC
Start: 2013-11-14 — End: 2013-11-14

## 2013-11-14 MED ORDER — PRENATAL MULTIVITAMIN CH
1.0000 | ORAL_TABLET | Freq: Every day | ORAL | Status: DC
  Administered 2013-11-15 – 2013-11-16 (×2): 1 via ORAL
  Filled 2013-11-14 (×2): qty 1

## 2013-11-14 MED ORDER — DIPHENHYDRAMINE HCL 50 MG/ML IJ SOLN: 12.5000 mg | INTRAMUSCULAR | Status: DC | PRN

## 2013-11-14 MED ORDER — SIMETHICONE 80 MG PO CHEW: 80.0000 mg | CHEWABLE_TABLET | ORAL | Status: DC | PRN

## 2013-11-14 MED ORDER — WITCH HAZEL-GLYCERIN EX PADS
1.0000 "application " | MEDICATED_PAD | CUTANEOUS | Status: DC | PRN
  Administered 2013-11-14: 1 via TOPICAL

## 2013-11-14 MED ORDER — IBUPROFEN 600 MG PO TABS
600.0000 mg | ORAL_TABLET | Freq: Four times a day (QID) | ORAL | Status: DC
Start: 2013-11-14 — End: 2013-11-16
  Administered 2013-11-14 – 2013-11-16 (×8): 600 mg via ORAL
  Filled 2013-11-14 (×8): qty 1

## 2013-11-14 MED ORDER — BENZOCAINE-MENTHOL 20-0.5 % EX AERO
1.0000 "application " | INHALATION_SPRAY | CUTANEOUS | Status: DC | PRN
  Administered 2013-11-14: 1 via TOPICAL
  Filled 2013-11-14 (×2): qty 56

## 2013-11-14 MED ORDER — LANOLIN HYDROUS EX OINT: TOPICAL_OINTMENT | CUTANEOUS | Status: DC | PRN

## 2013-11-14 MED ORDER — DIPHENHYDRAMINE HCL 25 MG PO CAPS: 25.0000 mg | ORAL_CAPSULE | Freq: Four times a day (QID) | ORAL | Status: DC | PRN

## 2013-11-14 MED ORDER — PHENYLEPHRINE 40 MCG/ML (10ML) SYRINGE FOR IV PUSH (FOR BLOOD PRESSURE SUPPORT)
80.0000 ug | PREFILLED_SYRINGE | INTRAVENOUS | Status: DC | PRN
  Filled 2013-11-14: qty 2

## 2013-11-14 MED ORDER — SENNOSIDES-DOCUSATE SODIUM 8.6-50 MG PO TABS
2.0000 | ORAL_TABLET | ORAL | Status: DC
  Administered 2013-11-14 – 2013-11-15 (×2): 2 via ORAL
  Filled 2013-11-14 (×2): qty 2

## 2013-11-14 MED ORDER — PHENYLEPHRINE 40 MCG/ML (10ML) SYRINGE FOR IV PUSH (FOR BLOOD PRESSURE SUPPORT)
PREFILLED_SYRINGE | INTRAVENOUS | Status: AC
  Filled 2013-11-14: qty 10

## 2013-11-14 MED ORDER — MEDROXYPROGESTERONE ACETATE 150 MG/ML IM SUSP: 150.0000 mg | INTRAMUSCULAR | Status: DC | PRN

## 2013-11-14 MED ORDER — EPHEDRINE 5 MG/ML INJ
INTRAVENOUS | Status: AC
  Filled 2013-11-14: qty 4

## 2013-11-14 MED ORDER — ZOLPIDEM TARTRATE 5 MG PO TABS
5.0000 mg | ORAL_TABLET | Freq: Every evening | ORAL | Status: DC | PRN
Start: 2013-11-14 — End: 2013-11-16

## 2013-11-14 MED ORDER — OXYTOCIN 40 UNITS IN LACTATED RINGERS INFUSION - SIMPLE MED
1.0000 m[IU]/min | INTRAVENOUS | Status: DC
  Administered 2013-11-14: 2 m[IU]/min via INTRAVENOUS

## 2013-11-14 MED ORDER — DEXTROSE 5 % IV SOLN
2.5000 10*6.[IU] | INTRAVENOUS | Status: DC
  Administered 2013-11-14: 2.5 10*6.[IU] via INTRAVENOUS
  Filled 2013-11-14 (×5): qty 2.5

## 2013-11-14 MED ORDER — ONDANSETRON HCL 4 MG/2ML IJ SOLN: 4.0000 mg | INTRAMUSCULAR | Status: DC | PRN

## 2013-11-14 MED ORDER — MEASLES, MUMPS & RUBELLA VAC ~~LOC~~ INJ: 0.5000 mL | INJECTION | Freq: Once | SUBCUTANEOUS | Status: DC

## 2013-11-14 MED ORDER — ONDANSETRON HCL 4 MG PO TABS
4.0000 mg | ORAL_TABLET | ORAL | Status: DC | PRN
Start: 2013-11-14 — End: 2013-11-16

## 2013-11-14 NOTE — Progress Notes (Signed)
Patient ID: Diana Fischer, female   DOB: 1986/07/30, 28 y.o.   MRN: [id number] Pt uncomfortable with ctxs No HA, scotomata VSSAF   BPs 130-140s/80-90s FHR 140s Cat 1 Ctxs q 4-5  DTRs 1/4   Gestational HTN Stable/normal labs Pt wants epidural IV Abx for GBS Anticipate SVD

## 2013-11-14 NOTE — Anesthesia Preprocedure Evaluation (Signed)
Anesthesia Evaluation  Patient identified by MRN, date of birth, ID band Patient awake    Reviewed: Allergy & Precautions, H&P , Patient's Chart, lab work & pertinent test results  Airway Mallampati: II TM Distance: >3 FB Neck ROM: full    Dental  (+) Teeth Intact   Pulmonary  breath sounds clear to auscultation        Cardiovascular hypertension, Rhythm:regular Rate:Normal     Neuro/Psych    GI/Hepatic   Endo/Other    Renal/GU      Musculoskeletal   Abdominal   Peds  Hematology   Anesthesia Other Findings       Reproductive/Obstetrics (+) Pregnancy                          Anesthesia Physical Anesthesia Plan  ASA: II  Anesthesia Plan: Epidural   Post-op Pain Management:    Induction:   Airway Management Planned:   Additional Equipment:   Intra-op Plan:   Post-operative Plan:   Informed Consent: I have reviewed the patients History and Physical, chart, labs and discussed the procedure including the risks, benefits and alternatives for the proposed anesthesia with the patient or authorized representative who has indicated his/her understanding and acceptance.   Dental Advisory Given  Plan Discussed with:   Anesthesia Plan Comments: (Labs checked- platelets confirmed with RN in room. Fetal heart tracing, per RN, reported to be stable enough for sitting procedure. Discussed epidural, and patient consents to the procedure:  included risk of possible headache,backache, failed block, allergic reaction, and nerve injury. This patient was asked if she had any questions or concerns before the procedure started.)        Anesthesia Quick Evaluation  

## 2013-11-14 NOTE — Anesthesia Procedure Notes (Addendum)

## 2013-11-14 NOTE — Lactation Note (Signed)
This note was copied from the chart of [place]. Lactation Consultation Note  Patient Name: Diana Fischer Date: 11/14/2013 Reason for consult: Initial assessment of this second-time mom and her newborn at 17 hours of life. Mom breastfed her first child for 6 weeks but experienced multiple difficulties; mom states this new baby is latching well and she knows how to hand express colostrum as needed. 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  LC encouraged STS and cue feedings.  Baby's first feeding was for 30 minutes with a LATCH score of 8.  Baby has fed several more times and mom states that this experience is much better than her first. [place] encouraged review of Baby and Me pp 9, 14 and 20-25 for STS and BF information. LC provided [employer] Resource brochure and reviewed [facility] services and list of community and web site resources.     Maternal Data Infant to breast within first hour of birth: Yes (initial LATCH score=8 and baby nursed for 30 minutes) Has patient been taught Hand Expression?: Yes (mom states she knows how to hand express colostrum/milk) Does the patient have breastfeeding experience prior to this delivery?: Yes  Feeding Feeding Type: Breast Fed Length of feed: 10 min  LATCH Score/Interventions              initial LATCH score=8 per RN assessment        Lactation Tools Discussed/Used   STS, cue feedings, hand expression  Consult Status Consult Status: Follow-up Date: 11/15/13 Follow-up type: In-patient    Diana Fischer [facility] 11/14/2013, 9:50 PM

## 2013-11-15 LAB — CBC
HCT: 33.5 % — ABNORMAL LOW (ref 36.0–46.0)
HEMOGLOBIN: 11.2 g/dL — AB (ref 12.0–15.0)
MCH: 31.1 pg (ref 26.0–34.0)
MCHC: 33.4 g/dL (ref 30.0–36.0)
MCV: 93.1 fL (ref 78.0–100.0)
Platelets: 260 10*3/uL (ref 150–400)
RBC: 3.6 MIL/uL — ABNORMAL LOW (ref 3.87–5.11)
RDW: 13.5 % (ref 11.5–15.5)
WBC: 19.2 10*3/uL — ABNORMAL HIGH (ref 4.0–10.5)

## 2013-11-15 NOTE — Lactation Note (Signed)
This note was copied from the chart of [place]. Lactation Consultation Note  Patient Name: Diana Fischer OACZY'S Date: 11/15/2013 Reason for consult: Follow-up assessment Mom had just latched baby to the right breast when I arrived. Baby was demonstrating a good rhythmic suck, swallowing motions noted. Mom reports baby is having trouble latching to the right breast but is latching well to the left. Baby nursed for 10 minutes, Mom's nipple was round when baby came off the breast. Baby began to root again but had difficulty organizing his suck. Changed Mom to side lying position, after several attempts baby settled down and would take an occasional suckle. BF basics reviewed with parents, baby was circumcised today. Advised baby will probably cluster feed tonight. Advised Mom baby should be at the breast 8-12 times in 24 hours for greater than 10 minutes. Mom is experienced BF but reports her 1st child did not latch well. She feels this baby is doing much better. Advised to call for assist if needed.   Maternal Data    Feeding Feeding Type: Breast Fed Length of feed: 10 min  LATCH Score/Interventions Latch: Repeated attempts needed to sustain latch, nipple held in mouth throughout feeding, stimulation needed to elicit sucking reflex. Intervention(s): Adjust position  Audible Swallowing: A few with stimulation  Type of Nipple: Everted at rest and after stimulation  Comfort (Breast/Nipple): Soft / non-tender     Hold (Positioning): No assistance needed to correctly position infant at breast. Intervention(s): Breastfeeding basics reviewed;Support Pillows;Position options;Skin to skin  LATCH Score: 8  Lactation Tools Discussed/Used     Consult Status Consult Status: Follow-up Date: 11/16/13 Follow-up type: In-patient    Katrine Coho 11/15/2013, 6:06 PM

## 2013-11-15 NOTE — Progress Notes (Signed)
Post Partum Day 1 Subjective: no complaints, up ad lib, voiding and tolerating PO  Objective: Blood pressure 125/77, pulse 77, temperature 97.9 F (36.6 C), temperature source Oral, resp. rate 18, height 5' 5.5" (1.664 m), weight 88.27 kg (194 lb 9.6 oz), last menstrual period 02/26/2013, SpO2 99.00%, unknown if currently breastfeeding.  Physical Exam:  General: alert, cooperative, appears stated age and no distress Lochia: appropriate Uterine Fundus: firm Incision: healing well DVT Evaluation: No evidence of DVT seen on physical exam.   Recent Labs  11/14/13 1714 11/15/13 0623  HGB 11.7* 11.2*  HCT 34.4* 33.5*    Assessment/Plan: Plan for discharge tomorrow, Breastfeeding and Circumcision prior to discharge   LOS: 2 days   Diana Fischer C 11/15/2013, 9:50 AM

## 2013-11-15 NOTE — Anesthesia Postprocedure Evaluation (Signed)
Anesthesia Post Note  Patient: Diana Fischer  Procedure(s) Performed: * No procedures listed *  Anesthesia type: Epidural  Patient location: Mother/Baby  Post pain: Pain level controlled  Post assessment: Post-op Vital signs reviewed  Last Vitals:  Filed Vitals:   11/15/13 0505  BP: 125/77  Pulse: 77  Temp: 36.6 C  Resp: 18    Post vital signs: Reviewed  Level of consciousness: awake  Complications: No apparent anesthesia complications

## 2013-11-16 MED ORDER — IBUPROFEN 600 MG PO TABS: 600.0000 mg | ORAL_TABLET | Freq: Four times a day (QID) | ORAL | Status: DC

## 2013-11-16 MED ORDER — OXYCODONE-ACETAMINOPHEN 5-325 MG PO TABS: 1.0000 | ORAL_TABLET | ORAL | Status: DC | PRN

## 2013-11-16 NOTE — Discharge Summary (Signed)
Obstetric Discharge Summary Reason for Admission: induction of labor Prenatal Procedures: none Intrapartum Procedures: spontaneous vaginal delivery Postpartum Procedures: none Complications-Operative and Postpartum: 2nd degree perineal laceration Hemoglobin  Date Value Ref Range Status  11/15/2013 11.2* 12.0 - 15.0 g/dL Final     HCT  Date Value Ref Range Status  11/15/2013 33.5* 36.0 - 46.0 % Final    Physical Exam:  General: alert, cooperative, appears stated age and no distress Lochia: appropriate Uterine Fundus: firm Incision: healing well DVT Evaluation: No evidence of DVT seen on physical exam.  Discharge Diagnoses: Term Pregnancy-delivered, Gestational HTN  Discharge Information: Date: 11/16/2013 Activity: pelvic rest Diet: routine Medications: Ibuprofen and Percocet Condition: stable Instructions: refer to practice specific booklet Discharge to: home   Newborn Data: Live born female  Birth Weight: 7 lb 8.3 oz (3410 g) APGAR: 9, 9  Home with mother and circ done.  Ayelet Gruenewald C 11/16/2013, 10:01 AM

## 2013-11-17 ENCOUNTER — Encounter (HOSPITAL_COMMUNITY)
Admission: RE | Admit: 2013-11-17 | Discharge: 2013-11-17 | Disposition: A | Discharge status: Home / Self Care | Payer: 59 | Source: Ambulatory Visit | Attending: Obstetrics and Gynecology | Admitting: Obstetrics and Gynecology

## 2013-11-17 DIAGNOSIS — O923 Agalactia: Secondary | ICD-10-CM | POA: Insufficient documentation

## 2013-12-01 ENCOUNTER — Encounter (HOSPITAL_COMMUNITY): Payer: Self-pay | Admitting: *Deleted

## 2013-12-18 ENCOUNTER — Encounter (HOSPITAL_COMMUNITY)
Admission: RE | Admit: 2013-12-18 | Discharge: 2013-12-18 | Disposition: A | Discharge status: Home / Self Care | Payer: 59 | Source: Ambulatory Visit | Attending: Obstetrics and Gynecology | Admitting: Obstetrics and Gynecology

## 2013-12-18 DIAGNOSIS — O923 Agalactia: Secondary | ICD-10-CM | POA: Insufficient documentation

## 2014-01-17 ENCOUNTER — Encounter (HOSPITAL_COMMUNITY)
Admission: RE | Admit: 2014-01-17 | Discharge: 2014-01-17 | Disposition: A | Discharge status: Home / Self Care | Payer: 59 | Source: Ambulatory Visit | Attending: Obstetrics and Gynecology | Admitting: Obstetrics and Gynecology

## 2014-01-17 DIAGNOSIS — O923 Agalactia: Secondary | ICD-10-CM | POA: Insufficient documentation

## 2014-02-17 ENCOUNTER — Encounter (HOSPITAL_COMMUNITY)
Admission: RE | Admit: 2014-02-17 | Discharge: 2014-02-17 | Disposition: A | Discharge status: Home / Self Care | Payer: 59 | Source: Ambulatory Visit | Attending: Obstetrics and Gynecology | Admitting: Obstetrics and Gynecology

## 2014-02-17 DIAGNOSIS — O923 Agalactia: Secondary | ICD-10-CM | POA: Insufficient documentation

## 2014-03-19 ENCOUNTER — Encounter (HOSPITAL_COMMUNITY)
Admission: RE | Admit: 2014-03-19 | Discharge: 2014-03-19 | Disposition: A | Discharge status: Home / Self Care | Payer: BC Managed Care – PPO | Source: Ambulatory Visit | Attending: Obstetrics and Gynecology | Admitting: Obstetrics and Gynecology

## 2014-03-19 DIAGNOSIS — O923 Agalactia: Secondary | ICD-10-CM | POA: Insufficient documentation

## 2014-04-19 ENCOUNTER — Encounter (HOSPITAL_COMMUNITY): Payer: BC Managed Care – PPO | Attending: Obstetrics and Gynecology

## 2014-04-19 DIAGNOSIS — O923 Agalactia: Secondary | ICD-10-CM | POA: Insufficient documentation

## 2014-05-20 ENCOUNTER — Encounter (HOSPITAL_COMMUNITY)
Admission: RE | Admit: 2014-05-20 | Discharge: 2014-05-20 | Disposition: A | Discharge status: Home / Self Care | Payer: 59 | Source: Ambulatory Visit | Attending: Obstetrics and Gynecology | Admitting: Obstetrics and Gynecology

## 2014-05-20 DIAGNOSIS — O923 Agalactia: Secondary | ICD-10-CM | POA: Insufficient documentation

## 2014-06-19 ENCOUNTER — Encounter (HOSPITAL_COMMUNITY): Payer: BC Managed Care – PPO

## 2014-06-19 ENCOUNTER — Encounter (HOSPITAL_COMMUNITY)
Admission: RE | Admit: 2014-06-19 | Discharge: 2014-06-19 | Disposition: A | Discharge status: Home / Self Care | Payer: BC Managed Care – PPO | Source: Ambulatory Visit | Attending: Obstetrics and Gynecology | Admitting: Obstetrics and Gynecology

## 2014-06-19 DIAGNOSIS — O923 Agalactia: Secondary | ICD-10-CM | POA: Diagnosis present

## 2014-07-13 ENCOUNTER — Encounter (HOSPITAL_COMMUNITY): Payer: Self-pay | Admitting: *Deleted

## 2014-07-20 ENCOUNTER — Encounter (HOSPITAL_COMMUNITY)
Admission: RE | Admit: 2014-07-20 | Discharge: 2014-07-20 | Disposition: A | Discharge status: Home / Self Care | Payer: BC Managed Care – PPO | Source: Ambulatory Visit | Attending: Obstetrics and Gynecology | Admitting: Obstetrics and Gynecology

## 2014-07-20 DIAGNOSIS — O923 Agalactia: Secondary | ICD-10-CM | POA: Insufficient documentation

## 2014-08-19 ENCOUNTER — Encounter (HOSPITAL_COMMUNITY)
Admission: RE | Admit: 2014-08-19 | Discharge: 2014-08-19 | Disposition: A | Discharge status: Home / Self Care | Payer: BC Managed Care – PPO | Source: Ambulatory Visit | Attending: Obstetrics and Gynecology | Admitting: Obstetrics and Gynecology

## 2014-08-19 DIAGNOSIS — O923 Agalactia: Secondary | ICD-10-CM | POA: Insufficient documentation

## 2014-09-20 ENCOUNTER — Encounter (HOSPITAL_COMMUNITY)
Admission: RE | Admit: 2014-09-20 | Discharge: 2014-09-20 | Disposition: A | Discharge status: Home / Self Care | Payer: BC Managed Care – PPO | Source: Ambulatory Visit | Attending: Obstetrics and Gynecology | Admitting: Obstetrics and Gynecology

## 2014-09-20 DIAGNOSIS — O923 Agalactia: Secondary | ICD-10-CM | POA: Insufficient documentation

## 2014-10-20 ENCOUNTER — Encounter (HOSPITAL_COMMUNITY)
Admission: RE | Admit: 2014-10-20 | Discharge: 2014-10-20 | Disposition: A | Discharge status: Home / Self Care | Payer: BC Managed Care – PPO | Source: Ambulatory Visit | Attending: Obstetrics and Gynecology | Admitting: Obstetrics and Gynecology

## 2014-10-20 DIAGNOSIS — O923 Agalactia: Secondary | ICD-10-CM | POA: Insufficient documentation

## 2014-11-19 ENCOUNTER — Encounter (HOSPITAL_COMMUNITY)
Admission: RE | Admit: 2014-11-19 | Discharge: 2014-11-19 | Disposition: A | Discharge status: Home / Self Care | Payer: Self-pay | Source: Ambulatory Visit | Attending: Obstetrics and Gynecology | Admitting: Obstetrics and Gynecology

## 2014-11-19 DIAGNOSIS — O923 Agalactia: Secondary | ICD-10-CM | POA: Insufficient documentation

## 2015-09-08 ENCOUNTER — Inpatient Hospital Stay (HOSPITAL_COMMUNITY): Admission: AD | Admit: 2015-09-08 | Payer: 59 | Source: Ambulatory Visit | Admitting: Obstetrics and Gynecology

## 2015-09-10 ENCOUNTER — Encounter: Payer: Self-pay | Admitting: Emergency Medicine

## 2015-09-10 ENCOUNTER — Emergency Department
Admission: EM | Admit: 2015-09-10 | Discharge: 2015-09-10 | Disposition: A | Discharge status: Home / Self Care | Payer: Self-pay | Source: Home / Self Care | Attending: Family Medicine | Admitting: Family Medicine

## 2015-09-10 DIAGNOSIS — N390 Urinary tract infection, site not specified: Secondary | ICD-10-CM

## 2015-09-10 LAB — POCT URINALYSIS DIP (MANUAL ENTRY)
Bilirubin, UA: NEGATIVE
Glucose, UA: NEGATIVE
Ketones, POC UA: NEGATIVE
Nitrite, UA: NEGATIVE
Protein Ur, POC: NEGATIVE
Spec Grav, UA: 1.02 (ref 1.005–1.03)
Urobilinogen, UA: 0.2 (ref 0–1)
pH, UA: 6.5 (ref 5–8)

## 2015-09-10 MED ORDER — CEPHALEXIN 500 MG PO CAPS: 500.0000 mg | ORAL_CAPSULE | Freq: Two times a day (BID) | ORAL | Status: DC

## 2015-09-10 NOTE — ED Notes (Signed)
Reports dysuria x 6 days but has been taking Cipro from previous UTI; now out of rx.

## 2015-09-10 NOTE — ED Provider Notes (Signed)
CSN: [id number]     Arrival date & time 09/10/15  1623 History   First MD Initiated Contact with Patient 09/10/15 1716     Chief Complaint  Patient presents with  . Dysuria   (Consider location/radiation/quality/duration/timing/severity/associated sxs/prior Treatment) HPI  Pt is a 29yo female presenting to [facility] with c/o dysuria with associated mild nausea, mild lower abdominal discomfort, bilateral back cramping and soreness as well as urinary frequency and urgency.  Symptoms started about 6 days ago.  She had leftover cipro from when she had a kidney stone a few months ago.  She has taken cipro twice daily for 6 days.  Symptoms were improving some but are now just mild and persistent.  Pt concerned she needs additional antibiotics. She has not tried any OTC Azo. She has had a subjective fever. The kidney stone she had passed on its own w/o intervention. Pt states "it was very tiny."  Denies vaginal symptoms.   Past Medical History  Diagnosis Date  . Gestational hypertension    Past Surgical History  Procedure Laterality Date  . No past surgeries     Family History  Problem Relation Age of Onset  . Hypertension Mother    Social History  Substance Use Topics  . Smoking status: Never Smoker   . Smokeless tobacco: Never Used  . Alcohol Use: No   OB History    Gravida Para Term Preterm AB TAB SAB Ectopic Multiple Living   2 2 2       2      Review of Systems  Constitutional: Positive for fever ( subjective) and chills.  Cardiovascular: Negative for chest pain and palpitations.  Gastrointestinal: Positive for nausea and abdominal pain ( discomfort). Negative for vomiting and diarrhea.  Genitourinary: Positive for dysuria, urgency and frequency. Negative for hematuria, decreased urine volume, vaginal bleeding, vaginal discharge, vaginal pain, menstrual problem and pelvic pain.  Musculoskeletal: Positive for myalgias, back pain and arthralgias.  Neurological: Positive for headaches.  Negative for dizziness and light-headedness.  All other systems reviewed and are negative.   Allergies  Review of patient's allergies indicates no known allergies.  Home Medications   Prior to Admission medications   Medication Sig Start Date End Date Taking? Authorizing Provider  norethindrone-ethinyl estradiol (MICROGESTIN,JUNEL,LOESTRIN) 1-20 MG-MCG tablet Take 1 tablet by mouth daily.   Yes Historical Provider, MD  acetaminophen (TYLENOL) 325 MG tablet Take 650 mg by mouth as needed for moderate pain or headache.    Historical Provider, MD  calcium carbonate (TUMS - DOSED IN MG ELEMENTAL CALCIUM) 500 MG chewable tablet Chew 2-3 tablets by mouth 2 (two) times daily.    Historical Provider, MD  cephALEXin (KEFLEX) 500 MG capsule Take 1 capsule (500 mg total) by mouth 2 (two) times daily. For 7 days 09/10/15   Noland Fordyce, PA-C  ibuprofen (ADVIL,MOTRIN) 600 MG tablet Take 1 tablet (600 mg total) by mouth every 6 (six) hours. 11/16/13   Louretta Shorten, MD  oxyCODONE-acetaminophen (PERCOCET/ROXICET) 5-325 MG per tablet Take 1-2 tablets by mouth every 4 (four) hours as needed for severe pain (moderate - severe pain). 11/16/13   Louretta Shorten, MD  Prenatal Vit-Fe Fumarate-FA (PRENATAL MULTIVITAMIN) TABS tablet Take 1 tablet by mouth daily at 12 noon.    Historical Provider, MD   Meds Ordered and Administered this Visit  Medications - No data to display  BP 134/88 mmHg  Pulse 114  Temp(Src) 98.5 F (36.9 C) (Oral)  Resp 16  Ht 5\' 3"  (1.6 m)  Wt 160 lb (72.576 kg)  BMI 28.35 kg/m2  SpO2 100%  LMP 09/02/2015  Breastfeeding? Yes No data found.   Physical Exam  Constitutional: She is oriented to person, place, and time. She appears well-developed and well-nourished.  HENT:  Head: Normocephalic and atraumatic.  Mouth/Throat: Oropharynx is clear and moist.  Eyes: EOM are normal.  Neck: Normal range of motion.  Cardiovascular: Normal rate, regular rhythm and normal heart sounds.   Tachycardia  in triage but not on exam  Pulmonary/Chest: Effort normal and breath sounds normal. No respiratory distress. She has no wheezes. She has no rales. She exhibits no tenderness.  Abdominal: Soft. She exhibits no distension and no mass. There is no tenderness. There is no rebound, no guarding and no CVA tenderness.  Musculoskeletal: Normal range of motion.  Neurological: She is alert and oriented to person, place, and time.  Skin: Skin is warm and dry.  Psychiatric: She has a normal mood and affect. Her behavior is normal.  Nursing note and vitals reviewed.   ED Course  Procedures (including critical care time)  Labs Review Labs Reviewed  POCT URINALYSIS DIP (MANUAL ENTRY) - Abnormal; Notable for the following:    Color, UA light yellow (*)    Blood, UA trace-lysed (*)    Leukocytes, UA Trace (*)    All other components within normal limits  URINE CULTURE    Imaging Review No results found.    MDM   1. UTI (lower urinary tract infection)    Pt c/o 6 days of UTI symptoms despite using leftover cipro for the last 6 days.  Pt appears well, non-toxic. Tachycardic in triage but not during exam.  Pt appears well hydrated.  No CVAT.  UA: c/w UTI, will send culture. Will change pt from Cipro to Keflex as pt has already been on a decent dose of Cipro.  Encouraged good hydration. May take acetaminophen, ibuprofen and use OTC Azo for symptomatic treatment.  F/u with PCP in 4-5 days if not improving, sooner if worsening.  Patient verbalized understanding and agreement with treatment plan.     Noland Fordyce, PA-C 09/10/15 [phone]

## 2015-09-10 NOTE — Discharge Instructions (Signed)
°  You may take over the counter Azo to help with your urinary symptoms.  Your urine has been sent off for culture to make sure you are on the appropriate antibiotics.    Please take antibiotics as prescribed and be sure to complete entire course even if you start to feel better to ensure infection does not come back.

## 2015-09-11 LAB — URINE CULTURE
Colony Count: NO GROWTH
Organism ID, Bacteria: NO GROWTH

## 2015-09-12 ENCOUNTER — Telehealth: Payer: Self-pay | Admitting: Emergency Medicine

## 2015-11-02 LAB — HM PAP SMEAR: HM Pap smear: NEGATIVE

## 2016-06-29 ENCOUNTER — Encounter: Payer: Self-pay | Admitting: Emergency Medicine

## 2016-06-29 ENCOUNTER — Emergency Department
Admission: EM | Admit: 2016-06-29 | Discharge: 2016-06-29 | Disposition: A | Discharge status: Home / Self Care | Payer: 59 | Source: Home / Self Care | Attending: Family Medicine | Admitting: Family Medicine

## 2016-06-29 DIAGNOSIS — J028 Acute pharyngitis due to other specified organisms: Secondary | ICD-10-CM | POA: Diagnosis not present

## 2016-06-29 DIAGNOSIS — B9789 Other viral agents as the cause of diseases classified elsewhere: Secondary | ICD-10-CM

## 2016-06-29 DIAGNOSIS — J069 Acute upper respiratory infection, unspecified: Secondary | ICD-10-CM

## 2016-06-29 LAB — POCT RAPID STREP A (OFFICE): Rapid Strep A Screen: NEGATIVE

## 2016-06-29 MED ORDER — PREDNISONE 20 MG PO TABS: ORAL_TABLET | ORAL | 0 refills | Status: DC

## 2016-06-29 MED ORDER — AMOXICILLIN 875 MG PO TABS: 875.0000 mg | ORAL_TABLET | Freq: Two times a day (BID) | ORAL | 0 refills | Status: DC

## 2016-06-29 NOTE — Discharge Instructions (Signed)
Take plain guaifenesin (1200mg  extended release tabs such as Mucinex) twice daily, with plenty of water, for cough and congestion. Get adequate rest.   May use Afrin nasal spray (or generic oxymetazoline) twice daily for about 5 days and then discontinue.  Also recommend using saline nasal spray several times daily and saline nasal irrigation (AYR is a common brand).  Use Flonase nasal spray each morning after using Afrin nasal spray and saline nasal irrigation. Try warm salt water gargles for sore throat.  Stop all antihistamines for now, and other non-prescription cough/cold preparations. May take Delsym Cough Suppressant at bedtime for nighttime cough.  Begin Amoxicillin if not improving about one week or if persistent fever develops   Follow-up with family doctor if not improving about10 days.

## 2016-06-29 NOTE — ED Triage Notes (Signed)
Sore throat, congestion, sinus pain and pressure, ear pain, fever, chills x 4 days

## 2016-06-29 NOTE — ED Provider Notes (Signed)
[facility]    CSN: [id number] Arrival date & time: 06/29/16  1529     History   Chief Complaint Chief Complaint  Patient presents with  . Sore Throat    HPI Diana Fischer is a 30 y.o. female.   Patient complains of four day history of typical cold-like symptoms developing over several days, including mild sore throat, sinus congestion, myalgias, fever/chills, fatigue, and cough.  Her ears have felt full.  She states that she has a history of recurring sinusitis.   The history is provided by the patient.    Past Medical History:  Diagnosis Date  . Gestational hypertension     Patient Active Problem List   Diagnosis Date Noted  . Pre-eclampsia 11/13/2013  . PELVIC  PAIN 02/23/2011  . ACUTE PHARYNGITIS 12/14/2009  . URI 12/14/2009    Past Surgical History:  Procedure Laterality Date  . NO PAST SURGERIES      OB History    Gravida Para Term Preterm AB Living   2 2 2     2    SAB TAB Ectopic Multiple Live Births           2       Home Medications    Prior to Admission medications   Medication Sig Start Date End Date Taking? Authorizing Provider  Pseudoephedrine-APAP-DM (DAYQUIL MULTI-SYMPTOM COLD/FLU PO) Take by mouth.   Yes Historical Provider, Fischer  acetaminophen (TYLENOL) 325 MG tablet Take 650 mg by mouth as needed for moderate pain or headache.    Historical Provider, Fischer  amoxicillin (AMOXIL) 875 MG tablet Take 1 tablet (875 mg total) by mouth 2 (two) times daily. (Rx void after 07/07/16) 06/29/16   Diana Fischer  calcium carbonate (TUMS - DOSED IN MG ELEMENTAL CALCIUM) 500 MG chewable tablet Chew 2-3 tablets by mouth 2 (two) times daily.    Historical Provider, Fischer  ibuprofen (ADVIL,MOTRIN) 600 MG tablet Take 1 tablet (600 mg total) by mouth every 6 (six) hours. 11/16/13   Diana Fischer  norethindrone-ethinyl estradiol (MICROGESTIN,JUNEL,LOESTRIN) 1-20 MG-MCG tablet Take 1 tablet by mouth daily.    Historical Provider, Fischer  predniSONE  (DELTASONE) 20 MG tablet Take one tab by mouth twice daily for 5 days, then one daily. Take with food. 06/29/16   Diana Fischer  Prenatal Vit-Fe Fumarate-FA (PRENATAL MULTIVITAMIN) TABS tablet Take 1 tablet by mouth daily at 12 noon.    Historical Provider, Fischer    Family History Family History  Problem Relation Age of Onset  . Hypertension Mother     Social History Social History  Substance Use Topics  . Smoking status: Never Smoker  . Smokeless tobacco: Never Used  . Alcohol use No     Allergies   Review of patient's allergies indicates no known allergies.   Review of Systems Review of Systems + sore throat + cough No pleuritic pain No wheezing + nasal congestion + post-nasal drainage No sinus pain/pressure No itchy/red eyes ? earache No hemoptysis No SOB + fever, + chills No nausea No vomiting No abdominal pain No diarrhea No urinary symptoms No skin rash + fatigue + myalgias No headache Used OTC meds without relief   Physical Exam Triage Vital Signs ED Triage Vitals  Enc Vitals Group     BP 06/29/16 1547 (!) 157/117     Pulse Rate 06/29/16 1547 77     Resp --      Temp 06/29/16 1547 98.1 F (36.7 C)  Temp Source 06/29/16 1547 Oral     SpO2 06/29/16 1547 97 %     Weight 06/29/16 1548 160 lb (72.6 kg)     Height 06/29/16 1548 5\' 3"  (1.6 m)     Head Circumference --      Peak Flow --      Pain Score 06/29/16 1549 3     Pain Loc --      Pain Edu? --      Excl. in [place]? --    No data found.   Updated Vital Signs BP (!) 157/117 (BP Location: Left Arm)   Pulse 77   Temp 98.1 F (36.7 C) (Oral)   Ht 5\' 3"  (1.6 m)   Wt 160 lb (72.6 kg)   LMP 06/29/2016   SpO2 97%   BMI 28.34 kg/m   Visual Acuity Right Eye Distance:   Left Eye Distance:   Bilateral Distance:    Right Eye Near:   Left Eye Near:    Bilateral Near:     Physical Exam Nursing notes and Vital Signs reviewed. Appearance:  Patient appears stated age, and in no acute  distress Eyes:  Pupils are equal, round, and reactive to light and accomodation.  Extraocular movement is intact.  Conjunctivae are not inflamed  Ears:  Canals normal.  Tympanic membranes normal.  Nose:  Congested turbinates.  No sinus tenderness.   Pharynx:  Normal Neck:  Supple.  Tender enlarged posterior/lateral nodes are palpated bilaterally  Lungs:  Clear to auscultation.  Breath sounds are equal.  Moving air well. Heart:  Regular rate and rhythm without murmurs, rubs, or gallops.  Abdomen:  Nontender without masses or hepatosplenomegaly.  Bowel sounds are present.  No CVA or flank tenderness.  Extremities:  No edema.  Skin:  No rash present.    UC Treatments / Results  Labs (all labs ordered are listed, but only abnormal results are displayed) Labs Reviewed  POCT RAPID STREP A (OFFICE) negative    EKG  EKG Interpretation None       Radiology No results found.  Procedures Procedures (including critical care time)  Medications Ordered in UC Medications - No data to display   Initial Impression / Assessment and Plan / UC Course  I have reviewed the triage vital signs and the nursing notes.  Pertinent labs & imaging results that were available during my care of the patient were reviewed by me and considered in my medical decision making (see chart for details).  Clinical Course  There is no evidence of bacterial infection today.  Treat symptomatically for now  Begin prednisone burst/taper. Take plain guaifenesin (1200mg  extended release tabs such as Mucinex) twice daily, with plenty of water, for cough and congestion. Get adequate rest.   May use Afrin nasal spray (or generic oxymetazoline) twice daily for about 5 days and then discontinue.  Also recommend using saline nasal spray several times daily and saline nasal irrigation (AYR is a common brand).  Use Flonase nasal spray each morning after using Afrin nasal spray and saline nasal irrigation. Try warm salt water  gargles for sore throat.  Stop all antihistamines for now, and other non-prescription cough/cold preparations. May take Delsym Cough Suppressant at bedtime for nighttime cough.  Begin Amoxicillin if not improving about one week or if persistent fever develops (Given a prescription to hold, with an expiration date)  Follow-up with family doctor if not improving about10 days.      Final Clinical Impressions(s) / UC Diagnoses  Final diagnoses:  Acute pharyngitis due to other specified organisms  Viral URI with cough    New Prescriptions New Prescriptions   AMOXICILLIN (AMOXIL) 875 MG TABLET    Take 1 tablet (875 mg total) by mouth 2 (two) times daily. (Rx void after 07/07/16)   PREDNISONE (DELTASONE) 20 MG TABLET    Take one tab by mouth twice daily for 5 days, then one daily. Take with food.     Diana Fischer 07/06/16 [phone]

## 2016-07-13 ENCOUNTER — Ambulatory Visit: Payer: Self-pay | Admitting: Osteopathic Medicine

## 2016-07-22 ENCOUNTER — Encounter: Payer: Self-pay | Admitting: Emergency Medicine

## 2016-07-22 ENCOUNTER — Emergency Department
Admission: EM | Admit: 2016-07-22 | Discharge: 2016-07-22 | Disposition: A | Discharge status: Home / Self Care | Payer: 59 | Source: Home / Self Care | Attending: Family Medicine | Admitting: Family Medicine

## 2016-07-22 DIAGNOSIS — L738 Other specified follicular disorders: Secondary | ICD-10-CM

## 2016-07-22 LAB — POCT CBC W AUTO DIFF (K'VILLE URGENT CARE)

## 2016-07-22 MED ORDER — DOXYCYCLINE HYCLATE 100 MG PO CAPS: 100.0000 mg | ORAL_CAPSULE | Freq: Two times a day (BID) | ORAL | 0 refills | Status: DC

## 2016-07-22 NOTE — ED Triage Notes (Signed)
Pt c/o rash on head into hair yesterday, very painful, headache x 1 week, some nausea, no fever

## 2016-07-22 NOTE — Discharge Instructions (Signed)
May take Ibuprofen 200mg , 4 tabs every 8 hours with food for scalp soreness.

## 2016-07-22 NOTE — ED Provider Notes (Signed)
[facility]    CSN: [id number] Arrival date & time: 07/22/16  1031     History   Chief Complaint Chief Complaint  Patient presents with  . Rash    HPI Diana Fischer is a 30 y.o. female.   Patient complains of onset of itchy spots on her scalp yesterday.  She had noticed soreness in posterior occipital nodes two days ago.  She feels well otherwise.  She recalls having had a URI about two weeks ago that resolved.  She also notes that she had worn a wig for a few hours during Halloween.   The history is provided by the patient.  Rash  Location:  Head/neck Head/neck rash location:  Scalp Quality: dryness, itchiness, painful, redness and scaling   Quality: not blistering, not burning, not draining, not peeling, not swelling and not weeping   Pain details:    Quality: itching.   Severity:  Mild   Onset quality:  Sudden   Duration:  1 day   Timing:  Constant   Progression:  Worsening Severity:  Mild Onset quality:  Sudden Duration:  1 day Timing:  Constant Progression:  Unchanged Chronicity:  New Context: not animal contact, not chemical exposure, not exposure to similar rash, not food, not hot tub use, not insect bite/sting, not medications, not new detergent/soap, not nuts, not plant contact and not sick contacts   Relieved by:  Nothing Worsened by:  Nothing Ineffective treatments:  None tried Associated symptoms: sore throat   Associated symptoms: no fatigue, no fever, no headaches, no induration, no joint pain, no myalgias, no nausea and no URI     Past Medical History:  Diagnosis Date  . Gestational hypertension     Patient Active Problem List   Diagnosis Date Noted  . Pre-eclampsia 11/13/2013  . PELVIC  PAIN 02/23/2011  . ACUTE PHARYNGITIS 12/14/2009  . URI 12/14/2009    Past Surgical History:  Procedure Laterality Date  . NO PAST SURGERIES      OB History    Gravida Para Term Preterm AB Living   2 2 2     2    SAB TAB Ectopic  Multiple Live Births           2       Home Medications    Prior to Admission medications   Medication Sig Start Date End Date Taking? Authorizing Provider  doxycycline (VIBRAMYCIN) 100 MG capsule Take 1 capsule (100 mg total) by mouth 2 (two) times daily. Take with food. 07/22/16   Kandra Nicolas, MD    Family History Family History  Problem Relation Age of Onset  . Hypertension Mother     Social History Social History  Substance Use Topics  . Smoking status: Never Smoker  . Smokeless tobacco: Never Used  . Alcohol use No     Allergies   Patient has no known allergies.   Review of Systems Review of Systems  Constitutional: Negative for fatigue and fever.  HENT: Positive for sore throat.   Gastrointestinal: Negative for nausea.  Musculoskeletal: Negative for arthralgias and myalgias.  Skin: Positive for rash.  Neurological: Negative for headaches.  All other systems reviewed and are negative.    Physical Exam Triage Vital Signs ED Triage Vitals  Enc Vitals Group     BP 07/22/16 1101 166/99     Pulse Rate 07/22/16 1101 108     Resp --      Temp 07/22/16 1101 98.5 F (36.9 C)  Temp Source 07/22/16 1101 Oral     SpO2 07/22/16 1101 99 %     Weight 07/22/16 1101 163 lb 4 oz (74 kg)     Height 07/22/16 1101 5' 3.5" (1.613 m)     Head Circumference --      Peak Flow --      Pain Score 07/22/16 1103 5     Pain Loc --      Pain Edu? --      Excl. in [place]? --    No data found.   Updated Vital Signs BP 166/99 (BP Location: Left Arm)   Pulse 108   Temp 98.5 F (36.9 C) (Oral)   Ht 5' 3.5" (1.613 m)   Wt 163 lb 4 oz (74 kg)   LMP 06/29/2016   SpO2 99%   BMI 28.46 kg/m   Visual Acuity Right Eye Distance:   Left Eye Distance:   Bilateral Distance:    Right Eye Near:   Left Eye Near:    Bilateral Near:     Physical Exam  Constitutional: She appears well-developed and well-nourished. No distress.  HENT:  Head:    Right Ear: External ear  normal.  Left Ear: External ear normal.  Nose: Nose normal.  Mouth/Throat: Oropharynx is clear and moist.  Scalp has bilateral scattered follicular erythematous collars about 2 to 33mm diameter, some with small pustules present. Scattered tender shotty occipital nodes present.   Eyes: Conjunctivae and EOM are normal. Pupils are equal, round, and reactive to light.  Neck: Neck supple.  Shotty tender occipital nodes are present.  Cardiovascular: Normal heart sounds.   Pulmonary/Chest: Breath sounds normal.  Abdominal: There is no tenderness.  Musculoskeletal: She exhibits no edema.  Neurological: She is alert.  Skin: Skin is warm and dry.  Nursing note and vitals reviewed.    UC Treatments / Results  Labs (all labs ordered are listed, but only abnormal results are displayed) Labs Reviewed  POCT CBC W AUTO DIFF ([facility]):  WBC 6.5; LY 32.2; MO 8.6; GR 59.2; Hgb 14.4; Platelets 337     EKG  EKG Interpretation None       Radiology No results found.  Procedures Procedures (including critical care time)  Medications Ordered in UC Medications - No data to display   Initial Impression / Assessment and Plan / UC Course  I have reviewed the triage vital signs and the nursing notes.  Pertinent labs & imaging results that were available during my care of the patient were reviewed by me and considered in my medical decision making (see chart for details).  Clinical Course   Normal WBC reassuring. Begin doxycycline for staph coverage. May take Ibuprofen 200mg , 4 tabs every 8 hours with food for scalp soreness. Followup with dermatologist if not improving one week.     Final Clinical Impressions(s) / UC Diagnoses   Final diagnoses:  Bacterial folliculitis    New Prescriptions New Prescriptions   DOXYCYCLINE (VIBRAMYCIN) 100 MG CAPSULE    Take 1 capsule (100 mg total) by mouth 2 (two) times daily. Take with food.     Kandra Nicolas, MD 07/25/16 2158

## 2016-07-24 ENCOUNTER — Telehealth: Payer: Self-pay | Admitting: Emergency Medicine

## 2016-07-24 NOTE — Telephone Encounter (Signed)
Hope the rash is getting better have a great week

## 2016-07-28 ENCOUNTER — Ambulatory Visit (INDEPENDENT_AMBULATORY_CARE_PROVIDER_SITE_OTHER): Payer: 59 | Admitting: Osteopathic Medicine

## 2016-07-28 ENCOUNTER — Encounter: Payer: Self-pay | Admitting: Osteopathic Medicine

## 2016-07-28 VITALS — BP 125/80 | HR 79 | Ht 63.5 in | Wt 160.0 lb

## 2016-07-28 DIAGNOSIS — Z8249 Family history of ischemic heart disease and other diseases of the circulatory system: Secondary | ICD-10-CM | POA: Diagnosis not present

## 2016-07-28 DIAGNOSIS — R030 Elevated blood-pressure reading, without diagnosis of hypertension: Secondary | ICD-10-CM

## 2016-07-28 DIAGNOSIS — Z8759 Personal history of other complications of pregnancy, childbirth and the puerperium: Secondary | ICD-10-CM | POA: Diagnosis not present

## 2016-07-28 NOTE — Patient Instructions (Addendum)
Tylenol [phone] mg 4 times per day (maximum 4000 mg per day)     How to Take Your Blood Pressure HOW DO I GET A BLOOD PRESSURE MACHINE?  You can buy an electronic home blood pressure machine at your local pharmacy. Insurance will sometimes cover the cost if you have a prescription.  Ask your doctor what type of machine is best for you. There are different machines for your arm and your wrist.  If you decide to buy a machine to check your blood pressure on your arm, first check the size of your arm so you can buy the right size cuff. To check the size of your arm:   Use a measuring tape that shows both inches and centimeters.   Wrap the measuring tape around the upper-middle part of your arm. You may need someone to help you measure.   Write down your arm measurement in both inches and centimeters.   To measure your blood pressure correctly, it is important to have the right size cuff.   If your arm is up to 13 inches (up to 34 centimeters), get an adult cuff size.  If your arm is 13 to 17 inches (35 to 44 centimeters), get a large adult cuff size.    If your arm is 17 to 20 inches (45 to 52 centimeters), get an adult thigh cuff.  WHAT DO THE NUMBERS MEAN?   There are two numbers that make up your blood pressure. For example: 120/80.  The first number (120 in our example) is called the "systolic pressure." It is a measure of the pressure in your blood vessels when your heart is pumping blood.  The second number (80 in our example) is called the "diastolic pressure." It is a measure of the pressure in your blood vessels when your heart is resting between beats.  Your doctor will tell you what your blood pressure should be. WHAT SHOULD I DO BEFORE I CHECK MY BLOOD PRESSURE?   Try to rest or relax for at least 30 minutes before you check your blood pressure.  Do not smoke.  Do not have any drinks with caffeine, such as:  Soda.  Coffee.  Tea.  Check your blood  pressure in a quiet room.  Sit down and stretch out your arm on a table. Keep your arm at about the level of your heart. Let your arm relax.  Make sure that your legs are not crossed. HOW DO I CHECK MY BLOOD PRESSURE?  Follow the directions that came with your machine.  Make sure you remove any tight-fitting clothing from your arm or wrist. Wrap the cuff around your upper arm or wrist. You should be able to fit a finger between the cuff and your arm. If you cannot fit a finger between the cuff and your arm, it is too tight and should be removed and rewrapped.  Some units require you to manually pump up the arm cuff.  Automatic units inflate the cuff when you press a button.  Cuff deflation is automatic in both models.  After the cuff is inflated, the unit measures your blood pressure and pulse. The readings are shown on a monitor. Hold still and breathe normally while the cuff is inflated.  Getting a reading takes less than a minute.  Some models store readings in a memory. Some provide a printout of readings. If your machine does not store your readings, keep a written record.  Take readings with you to your next  visit with your doctor. This information is not intended to replace advice given to you by your health care provider. Make sure you discuss any questions you have with your health care provider. Document Released: 08/10/2008 Document Revised: 09/18/2014 Document Reviewed: 10/23/2013 Elsevier Interactive Patient Education  2017 [employer].

## 2016-07-28 NOTE — Progress Notes (Signed)
HPI: Diana Fischer is a 30 y.o. female  who presents to [place] today, 07/28/16,  for chief complaint of:  Chief Complaint  Patient presents with  . Establish Care    BLOOD PRESSURE    Concern for hypertension: . Context: History of gestational hypertension/preeclampsia, patient has had recent elevated blood pressures at urgent care. . Duration: Within the past year has noticed elevated blood pressures . Assoc signs/symptoms: Some headaches on occasion    Past medical, surgical, social and family history reviewed: Patient Active Problem List   Diagnosis Date Noted  . Pre-eclampsia 11/13/2013  . PELVIC  PAIN 02/23/2011  . ACUTE PHARYNGITIS 12/14/2009  . URI 12/14/2009   Past Surgical History:  Procedure Laterality Date  . NO PAST SURGERIES     Social History  Substance Use Topics  . Smoking status: Never Smoker  . Smokeless tobacco: Never Used  . Alcohol use No   Family History  Problem Relation Age of Onset  . Hypertension Mother   . Hyperlipidemia Mother   . Hyperlipidemia Father   . Hypertension Father   . Heart attack Maternal Grandfather   . Cancer Paternal Grandfather      Current medication list and allergy/intolerance information reviewed:   No current outpatient prescriptions on file prior to visit.   No current facility-administered medications on file prior to visit.    No Known Allergies    Review of Systems:  Constitutional: No recent illness  HEENT: No  headache, no vision change  Cardiac: No  chest pain, No  pressure, No palpitations  Respiratory:  No  shortness of breath. No  Cough  Gastrointestinal: No  abdominal pain, no change on bowel habits  Musculoskeletal: No new myalgia/arthralgia  Skin: No  Rash  Hem/Onc: No  easy bruising/bleeding, No  abnormal lumps/bumps  Neurologic: No  weakness, No  Dizziness  Psychiatric: No  concerns with depression, No  concerns with anxiety  Exam:   BP 125/80   Pulse 79   Ht 5' 3.5" (1.613 m)   Wt 160 lb (72.6 kg)   LMP 06/29/2016   BMI 27.90 kg/m   Constitutional: VS see above. General Appearance: alert, well-developed, well-nourished, NAD  Eyes: Normal lids and conjunctive, non-icteric sclera  Ears, Nose, Mouth, Throat: MMM, Normal external inspection ears/nares/mouth/lips/gums.  Neck: No masses, trachea midline.   Respiratory: Normal respiratory effort. no wheeze, no rhonchi, no rales  Cardiovascular: S1/S2 normal, no murmur, no rub/gallop auscultated. RRR.   Musculoskeletal: Gait normal. Symmetric and independent movement of all extremities  Neurological: Normal balance/coordination. No tremor.  Skin: warm, dry, intact.   Psychiatric: Normal judgment/insight. Normal mood and affect. Oriented x3.      ASSESSMENT/PLAN:   Blood pressure on manual recheck was normal. Likely some element of whitecoat hypertension. Patient works as rad Designer, multimedia, advised she can have someone help her take her blood pressure at work, advised 140/90 or greater consistently needs further evaluation, we will also plan to recheck in the office here in the next few months.  She states that she and her husband are thinking of getting pregnant again. She is not currently on birth control. Would advise that she also discuss with her OB/GYN any other considerations/preconception counseling  If Tylenol not helping for headaches, further evaluation will be needed, RTC/ER precautions reviewed  Elevated blood pressure reading without diagnosis of hypertension - Plan: CBC with Differential/Platelet, COMPLETE METABOLIC PANEL WITH GFR, TSH, Urinalysis  Family history of heart disease -  Plan: Lipid panel  History of pre-eclampsia - Plan: CBC with Differential/Platelet, COMPLETE METABOLIC PANEL WITH GFR, TSH, Lipid panel, Urinalysis    Patient Instructions  Tylenol [phone] mg 4 times per day (maximum 4000 mg per day)      Visit summary with  medication list and pertinent instructions was printed for patient to review. All questions at time of visit were answered - patient instructed to contact office with any additional concerns. ER/RTC precautions were reviewed with the patient. Follow-up plan: Return in about 8 weeks (around 09/22/2016) for recheck BP .

## 2016-07-31 DIAGNOSIS — R030 Elevated blood-pressure reading, without diagnosis of hypertension: Secondary | ICD-10-CM | POA: Insufficient documentation

## 2016-07-31 DIAGNOSIS — Z8759 Personal history of other complications of pregnancy, childbirth and the puerperium: Secondary | ICD-10-CM | POA: Insufficient documentation

## 2016-07-31 DIAGNOSIS — Z8249 Family history of ischemic heart disease and other diseases of the circulatory system: Secondary | ICD-10-CM | POA: Insufficient documentation

## 2016-08-09 ENCOUNTER — Encounter: Payer: Self-pay | Admitting: Osteopathic Medicine

## 2016-08-09 DIAGNOSIS — Z124 Encounter for screening for malignant neoplasm of cervix: Secondary | ICD-10-CM | POA: Insufficient documentation

## 2016-09-08 ENCOUNTER — Encounter: Payer: Self-pay | Admitting: Osteopathic Medicine

## 2016-09-08 ENCOUNTER — Ambulatory Visit: Payer: 59 | Admitting: Osteopathic Medicine

## 2016-09-20 ENCOUNTER — Telehealth: Payer: 59 | Admitting: Nurse Practitioner

## 2016-09-20 DIAGNOSIS — R05 Cough: Secondary | ICD-10-CM

## 2016-09-20 DIAGNOSIS — R059 Cough, unspecified: Secondary | ICD-10-CM

## 2016-09-20 MED ORDER — AZITHROMYCIN 250 MG PO TABS: ORAL_TABLET | ORAL | 0 refills | Status: DC

## 2016-09-20 MED ORDER — BENZONATATE 100 MG PO CAPS: 100.0000 mg | ORAL_CAPSULE | Freq: Three times a day (TID) | ORAL | 0 refills | Status: DC | PRN

## 2016-09-20 NOTE — Progress Notes (Signed)

## 2017-01-16 DIAGNOSIS — N911 Secondary amenorrhea: Secondary | ICD-10-CM | POA: Diagnosis not present

## 2017-01-26 DIAGNOSIS — Z3481 Encounter for supervision of other normal pregnancy, first trimester: Secondary | ICD-10-CM | POA: Diagnosis not present

## 2017-01-26 DIAGNOSIS — Z3685 Encounter for antenatal screening for Streptococcus B: Secondary | ICD-10-CM | POA: Diagnosis not present

## 2017-01-26 LAB — OB RESULTS CONSOLE ABO/RH: RH TYPE: POSITIVE

## 2017-01-26 LAB — OB RESULTS CONSOLE GC/CHLAMYDIA
CHLAMYDIA, DNA PROBE: NEGATIVE
GC PROBE AMP, GENITAL: NEGATIVE

## 2017-01-26 LAB — OB RESULTS CONSOLE ANTIBODY SCREEN: ANTIBODY SCREEN: NEGATIVE

## 2017-01-26 LAB — OB RESULTS CONSOLE RPR: RPR: NONREACTIVE

## 2017-01-26 LAB — OB RESULTS CONSOLE HIV ANTIBODY (ROUTINE TESTING): HIV: NONREACTIVE

## 2017-01-26 LAB — OB RESULTS CONSOLE RUBELLA ANTIBODY, IGM: Rubella: IMMUNE

## 2017-01-26 LAB — OB RESULTS CONSOLE HEPATITIS B SURFACE ANTIGEN: Hepatitis B Surface Ag: NEGATIVE

## 2017-02-02 DIAGNOSIS — Z3400 Encounter for supervision of normal first pregnancy, unspecified trimester: Secondary | ICD-10-CM | POA: Diagnosis not present

## 2017-02-02 DIAGNOSIS — Z3A09 9 weeks gestation of pregnancy: Secondary | ICD-10-CM | POA: Diagnosis not present

## 2017-02-02 DIAGNOSIS — O3680X0 Pregnancy with inconclusive fetal viability, not applicable or unspecified: Secondary | ICD-10-CM | POA: Diagnosis not present

## 2017-02-11 ENCOUNTER — Telehealth: Payer: 59 | Admitting: Family

## 2017-02-11 DIAGNOSIS — L559 Sunburn, unspecified: Secondary | ICD-10-CM | POA: Diagnosis not present

## 2017-02-11 NOTE — Progress Notes (Signed)
We are sorry that you are not feeling well.  Here is how we plan to help!  Based on what you have shared with me, I'd like to share with you a treatment plan for sunburn.   Most sunburn is a first degree burn that turns the skin pink or red.  It can be painful to touch.  If you stayed in the sun for a prolonged period this might have progressed to a second degree burn with blistering!  Usually the pain and swelling starts after about 4 hours, peaks at 24 hours and begins to improve after 48 hours or about 2 days.  REMEMBER prolonged exposure to the sun increases your risk of skin cancer so use sunscreen before you go outside!  We will give you more information about sunscreen use later in your care plan.  Your sunburn can be managed by self-care at home.  Please use the following care guide to manage your sunburn.  If you symptoms worsen, you have other questions or concerns, or you develop any of the warnings signs listed in your care plan you will need to seek a face to face visit with a provider without waiting!  Home Care Advice for Treating Mild Sunburn:  1. Take Ibuprofen (Advil, Motrin) for pain relief as soon as possible.  The adult dosage is up to 600 mg every 6 hours.  Starting within 6 hours of sun exposure may greatly reduce your discomfort.  If you cannot take Ibuprofen you may use Acetaminophen instead.  Do not take Ibuprofen if you have stomach problems, kidney disease or are pregnant.  Do not take Ibuprofen if you have been told by your doctor or pharmacist to avoid this class of drugs.  Do not take Acetaminophen if you have liver disease.  Read the package warnings on any medication that you take!  2.  Use a steroid cream on the affected skin.  If you apply an over the counter steroid       cream as soon as possible and repeat it three times a day it may reduce the pain and      and swelling.  Until you get the steroid cream you may start with a moistening cream      cream  or aloe gel.  3.  Apply cool compresses to the burned areas several times a day.  4.  Avoid soap on the sunburned areas.  5.  Drink plenty of water.  It is easy to get dehydrated from prolong time in the sun      Outdoors. 6.  For any broken blisters:  Trim off the dead skin with fine scissors.  It is wise to clean the scissor with alcohol before use.  Apply antibiotic ointments to the blister.  Apply twice a day for three days.  There are triple antibiotic ointments with topical pain relievers available at stores.  Caution:leave intact blisters alone.  They are protecting the skin and will allow it to heal. 7.  Taking Vitamin C orally may reduce sun damage to your skin.  Follow the      Instructions on the bottle.  The recommended adult dosage is 2 grams.  What to Expect:  1. Pain usually stops after 2 or 3 days. 2. Skin flaking and peeling usually occurs for 3-7 days after a sunburn.  Call your provider if:  1. You feel very weak or have difficulty standing. 2. Blister develops on your face. 3. You become sensitive to light  because of eye pain. 4. Your skin looks infected (red streaks, puss or worsening tenderness after 48 hours. 5. You feel you should be seen.  Preventing Sunburns:  1. Apply 20-30 SPF sunscreen to your skin before going into the sun. 2. Reapply every 2-4 hours or after sweating or swimming. 3. Sunscreens protect from sunburns but do not completely prevent skin damage.  Nancy Fetter exposure still increases your risk of premature aging and skin cancers.  Your e-visit answers were reviewed by a board certified advanced clinical practitioner to complete your personal care plan.  Depending on the condition, your plan could have included both over the counter or prescription medications.  If there is a problem please reply  once you have received a response from your provider.  Your safety is important to [place].  If you have drug allergies check your prescription  carefully.    You can use MyChart to ask questions about today's visit, request a non-urgent call back, or ask for a work or school excuse for 24 hours related to this e-Visit. If it has been greater than 24 hours you will need to follow up with your provider, or enter a new e-Visit to address those concerns.  You will get an e-mail in the next two days asking about your experience.  I hope that your e-visit has been valuable and will speed your recovery. Thank you for using e-visits.

## 2017-02-16 DIAGNOSIS — Z3A11 11 weeks gestation of pregnancy: Secondary | ICD-10-CM | POA: Diagnosis not present

## 2017-02-16 DIAGNOSIS — Z3400 Encounter for supervision of normal first pregnancy, unspecified trimester: Secondary | ICD-10-CM | POA: Diagnosis not present

## 2017-02-16 DIAGNOSIS — Z113 Encounter for screening for infections with a predominantly sexual mode of transmission: Secondary | ICD-10-CM | POA: Diagnosis not present

## 2017-02-27 DIAGNOSIS — Z36 Encounter for antenatal screening for chromosomal anomalies: Secondary | ICD-10-CM | POA: Diagnosis not present

## 2017-02-27 DIAGNOSIS — Z3491 Encounter for supervision of normal pregnancy, unspecified, first trimester: Secondary | ICD-10-CM | POA: Diagnosis not present

## 2017-02-27 DIAGNOSIS — Z3A12 12 weeks gestation of pregnancy: Secondary | ICD-10-CM | POA: Diagnosis not present

## 2017-02-27 DIAGNOSIS — Z3683 Encounter for fetal screening for congenital cardiac abnormalities: Secondary | ICD-10-CM | POA: Diagnosis not present

## 2017-03-11 ENCOUNTER — Telehealth: Payer: 59 | Admitting: Family

## 2017-03-11 DIAGNOSIS — B9789 Other viral agents as the cause of diseases classified elsewhere: Secondary | ICD-10-CM

## 2017-03-11 DIAGNOSIS — J329 Chronic sinusitis, unspecified: Secondary | ICD-10-CM | POA: Diagnosis not present

## 2017-03-11 MED ORDER — FLUTICASONE PROPIONATE 50 MCG/ACT NA SUSP: 2.0000 | Freq: Every day | NASAL | 6 refills | Status: DC

## 2017-03-11 NOTE — Progress Notes (Signed)
Thank you for the details you put in the comment boxes. Those details really help [place] take better care of you. I tried to call you for clarification but could not reach you. See treatment plan below. If this goes beyond 5 days, please let us know and we will consider antibiotics. It is too early to do so at this time.   We are sorry that you are not feeling well.  Here is how we plan to help!  Based on what you have shared with me it looks like you have sinusitis.  Sinusitis is inflammation and infection in the sinus cavities of the head.  Based on your presentation I believe you most likely have Acute Viral Sinusitis.This is an infection most likely caused by a virus. There is not specific treatment for viral sinusitis other than to help you with the symptoms until the infection runs its course.  You may use an oral decongestant such as Mucinex D or if you have glaucoma or high blood pressure use plain Mucinex. Saline nasal spray help and can safely be used as often as needed for congestion, I have prescribed: Fluticasone nasal spray two sprays in each nostril daily.  Some authorities believe that zinc sprays or the use of Echinacea may shorten the course of your symptoms.  Sinus infections are not as easily transmitted as other respiratory infection, however we still recommend that you avoid close contact with loved ones, especially the very young and elderly.  Remember to wash your hands thoroughly throughout the day as this is the number one way to prevent the spread of infection!  Home Care:  Only take medications as instructed by your medical team.  Complete the entire course of an antibiotic.  Do not take these medications with alcohol.  A steam or ultrasonic humidifier can help congestion.  You can place a towel over your head and breathe in the steam from hot water coming from a faucet.  Avoid close contacts especially the very young and the elderly.  Cover your mouth when you cough or  sneeze.  Always remember to wash your hands.  Get Help Right Away If:  You develop worsening fever or sinus pain.  You develop a severe head ache or visual changes.  Your symptoms persist after you have completed your treatment plan.  Make sure you  Understand these instructions.  Will watch your condition.  Will get help right away if you are not doing well or get worse.  Your e-visit answers were reviewed by a board certified advanced clinical practitioner to complete your personal care plan.  Depending on the condition, your plan could have included both over the counter or prescription medications.  If there is a problem please reply  once you have received a response from your provider.  Your safety is important to [place].  If you have drug allergies check your prescription carefully.    You can use MyChart to ask questions about today's visit, request a non-urgent call back, or ask for a work or school excuse for 24 hours related to this e-Visit. If it has been greater than 24 hours you will need to follow up with your provider, or enter a new e-Visit to address those concerns.  You will get an e-mail in the next two days asking about your experience.  I hope that your e-visit has been valuable and will speed your recovery. Thank you for using e-visits.

## 2017-04-09 DIAGNOSIS — Z3492 Encounter for supervision of normal pregnancy, unspecified, second trimester: Secondary | ICD-10-CM | POA: Diagnosis not present

## 2017-04-09 DIAGNOSIS — Z3A18 18 weeks gestation of pregnancy: Secondary | ICD-10-CM | POA: Diagnosis not present

## 2017-04-09 DIAGNOSIS — Z363 Encounter for antenatal screening for malformations: Secondary | ICD-10-CM | POA: Diagnosis not present

## 2017-04-20 ENCOUNTER — Encounter (HOSPITAL_COMMUNITY): Payer: Self-pay | Admitting: *Deleted

## 2017-04-20 ENCOUNTER — Inpatient Hospital Stay (HOSPITAL_COMMUNITY)
Admission: AD | Admit: 2017-04-20 | Discharge: 2017-04-20 | Disposition: A | Discharge status: Home / Self Care | Payer: 59 | Source: Ambulatory Visit | Attending: Obstetrics and Gynecology | Admitting: Obstetrics and Gynecology

## 2017-04-20 DIAGNOSIS — O99512 Diseases of the respiratory system complicating pregnancy, second trimester: Secondary | ICD-10-CM | POA: Diagnosis not present

## 2017-04-20 DIAGNOSIS — R55 Syncope and collapse: Secondary | ICD-10-CM | POA: Diagnosis not present

## 2017-04-20 DIAGNOSIS — Z8249 Family history of ischemic heart disease and other diseases of the circulatory system: Secondary | ICD-10-CM | POA: Insufficient documentation

## 2017-04-20 DIAGNOSIS — R002 Palpitations: Secondary | ICD-10-CM | POA: Diagnosis not present

## 2017-04-20 DIAGNOSIS — O26892 Other specified pregnancy related conditions, second trimester: Secondary | ICD-10-CM | POA: Insufficient documentation

## 2017-04-20 DIAGNOSIS — O99282 Endocrine, nutritional and metabolic diseases complicating pregnancy, second trimester: Secondary | ICD-10-CM | POA: Diagnosis not present

## 2017-04-20 DIAGNOSIS — Z809 Family history of malignant neoplasm, unspecified: Secondary | ICD-10-CM | POA: Diagnosis not present

## 2017-04-20 DIAGNOSIS — E860 Dehydration: Secondary | ICD-10-CM | POA: Insufficient documentation

## 2017-04-20 DIAGNOSIS — Z79899 Other long term (current) drug therapy: Secondary | ICD-10-CM | POA: Diagnosis not present

## 2017-04-20 DIAGNOSIS — O9989 Other specified diseases and conditions complicating pregnancy, childbirth and the puerperium: Secondary | ICD-10-CM

## 2017-04-20 DIAGNOSIS — R102 Pelvic and perineal pain: Secondary | ICD-10-CM | POA: Diagnosis not present

## 2017-04-20 DIAGNOSIS — O219 Vomiting of pregnancy, unspecified: Secondary | ICD-10-CM | POA: Diagnosis not present

## 2017-04-20 DIAGNOSIS — Z3A22 22 weeks gestation of pregnancy: Secondary | ICD-10-CM | POA: Diagnosis not present

## 2017-04-20 DIAGNOSIS — K529 Noninfective gastroenteritis and colitis, unspecified: Secondary | ICD-10-CM | POA: Diagnosis not present

## 2017-04-20 DIAGNOSIS — R030 Elevated blood-pressure reading, without diagnosis of hypertension: Secondary | ICD-10-CM | POA: Diagnosis not present

## 2017-04-20 LAB — URINALYSIS, ROUTINE W REFLEX MICROSCOPIC
Bilirubin Urine: NEGATIVE
GLUCOSE, UA: NEGATIVE mg/dL
Hgb urine dipstick: NEGATIVE
Ketones, ur: NEGATIVE mg/dL
Leukocytes, UA: NEGATIVE
Nitrite: NEGATIVE
PH: 7 (ref 5.0–8.0)
Protein, ur: NEGATIVE mg/dL
Specific Gravity, Urine: 1.01 (ref 1.005–1.030)

## 2017-04-20 LAB — COMPREHENSIVE METABOLIC PANEL
ALT: 13 U/L — ABNORMAL LOW (ref 14–54)
AST: 20 U/L (ref 15–41)
Albumin: 3.1 g/dL — ABNORMAL LOW (ref 3.5–5.0)
Alkaline Phosphatase: 83 U/L (ref 38–126)
Anion gap: 8 (ref 5–15)
BUN: 8 mg/dL (ref 6–20)
CALCIUM: 9.6 mg/dL (ref 8.9–10.3)
CHLORIDE: 102 mmol/L (ref 101–111)
CO2: 25 mmol/L (ref 22–32)
Creatinine, Ser: 0.49 mg/dL (ref 0.44–1.00)
GFR calc Af Amer: >60 mL/min (ref >60)
Glucose, Bld: 84 mg/dL (ref 65–99)
Potassium: 3.4 mmol/L — ABNORMAL LOW (ref 3.5–5.1)
Sodium: 135 mmol/L (ref 135–145)
Total Bilirubin: 0.3 mg/dL (ref 0.3–1.2)
Total Protein: 7 g/dL (ref 6.5–8.1)

## 2017-04-20 LAB — CBC WITH DIFFERENTIAL/PLATELET
Basophils Absolute: 0 10*3/uL (ref 0.0–0.1)
Basophils Relative: 0 %
Eosinophils Absolute: 0.1 10*3/uL (ref 0.0–0.7)
Eosinophils Relative: 1 %
HCT: 35.4 % — ABNORMAL LOW (ref 36.0–46.0)
Hemoglobin: 12.2 g/dL (ref 12.0–15.0)
Lymphocytes Relative: 21 %
Lymphs Abs: 2.7 10*3/uL (ref 0.7–4.0)
MCH: 31.3 pg (ref 26.0–34.0)
MCHC: 34.5 g/dL (ref 30.0–36.0)
MCV: 90.8 fL (ref 78.0–100.0)
MONO ABS: 0.5 10*3/uL (ref 0.1–1.0)
MONOS PCT: 4 %
Neutro Abs: 9.6 10*3/uL — ABNORMAL HIGH (ref 1.7–7.7)
Neutrophils Relative %: 74 %
PLATELETS: 272 10*3/uL (ref 150–400)
RBC: 3.9 MIL/uL (ref 3.87–5.11)
RDW: 12.9 % (ref 11.5–15.5)
WBC: 12.8 10*3/uL — ABNORMAL HIGH (ref 4.0–10.5)

## 2017-04-20 MED ORDER — ONDANSETRON HCL 4 MG/2ML IJ SOLN
4.0000 mg | Freq: Once | INTRAMUSCULAR | Status: AC
  Administered 2017-04-20: 4 mg via INTRAVENOUS
  Filled 2017-04-20: qty 2

## 2017-04-20 MED ORDER — ACETAMINOPHEN 325 MG PO TABS
650.0000 mg | ORAL_TABLET | Freq: Once | ORAL | Status: AC
  Administered 2017-04-20: 650 mg via ORAL
  Filled 2017-04-20: qty 2

## 2017-04-20 MED ORDER — LACTATED RINGERS IV SOLN
INTRAVENOUS | Status: AC
  Administered 2017-04-20: 18:00:00 via INTRAVENOUS

## 2017-04-20 MED ORDER — ONDANSETRON 4 MG PO TBDP: 4.0000 mg | ORAL_TABLET | Freq: Three times a day (TID) | ORAL | 0 refills | Status: DC | PRN

## 2017-04-20 NOTE — MAU Provider Note (Signed)
[facility] Provider Note   CSN: [id number] Arrival date & time: 04/20/17  1550     History   Chief Complaint Chief Complaint  Patient presents with  . Nausea  . Headache  . Fatigue  . Palpitations    HPI Diana Fischer is a 31 y.o. [phone] @ [redacted]w[redacted]d gestation who presents to the MAU with dehydration. Patient reports that 4 days ago she started vomiting and diarrhea. She has continued and now developed a headache and feeling dehydrated. She called her OB and they told her to start back on her medications for n/v and her zantac. She started them and felt a little better but today got worse again and felt light headed.   Headache   This is a new problem. The current episode started in the past 7 days. The problem occurs constantly. The problem has been unchanged. The pain is located in the frontal region. The pain does not radiate. The quality of the pain is described as band-like. The pain is at a severity of 7/10. Associated symptoms include blurred vision, dizziness, eye watering, nausea, photophobia and vomiting. Pertinent negatives include no abdominal pain, anorexia, back pain, coughing, ear pain, eye pain, eye redness, fever, hearing loss, loss of balance, neck pain, rhinorrhea, sinus pressure, sore throat or weight loss. Nothing aggravates the symptoms. She has tried nothing for the symptoms. There is no history of hypertension, migraine headaches or migraines in the family.  Palpitations   Associated symptoms include dizziness, nausea and vomiting. Pertinent negatives include no chest pain, coughing, fever or shortness of breath.    Past Medical History:  Diagnosis Date  . Gestational hypertension     Patient Active Problem List   Diagnosis Date Noted  . Pap smear for cervical cancer screening 08/09/2016  . Elevated blood pressure reading without diagnosis of hypertension 07/31/2016  . Family history of heart disease 07/31/2016  . History of pre-eclampsia  07/31/2016  . Pre-eclampsia 11/13/2013  . PELVIC  PAIN 02/23/2011  . ACUTE PHARYNGITIS 12/14/2009  . URI 12/14/2009    Past Surgical History:  Procedure Laterality Date  . NO PAST SURGERIES      OB History    Gravida Para Term Preterm AB Living   3 2 2     2    SAB TAB Ectopic Multiple Live Births           2       Home Medications    Prior to Admission medications   Medication Sig Start Date End Date Taking? Authorizing Provider  acetaminophen (TYLENOL) 325 MG tablet Take 325 mg by mouth every 6 (six) hours as needed for headache.   Yes [provider]  calcium carbonate (TUMS - DOSED IN MG ELEMENTAL CALCIUM) 500 MG chewable tablet Chew 1 tablet by mouth 4 (four) times daily as needed for indigestion or heartburn.   Yes [provider]  Doxylamine-Pyridoxine (DICLEGIS) 10-10 MG TBEC Take 1 tablet by mouth at bedtime.   Yes [provider]  Prenatal Vit-Fe Fumarate-FA (PRENATAL MULTIVITAMIN) TABS tablet Take 1 tablet by mouth at bedtime.   Yes [provider]  ranitidine (ZANTAC) 150 MG tablet Take 150 mg by mouth at bedtime.   Yes [provider]  fluticasone (FLONASE) 50 MCG/ACT nasal spray Place 2 sprays into both nostrils daily. Patient not taking: Reported on 04/20/2017 03/11/17   Benjamine Mola, FNP  ondansetron (ZOFRAN ODT) 4 MG disintegrating tablet Take 1 tablet (4 mg total) by mouth  every 8 (eight) hours as needed for nausea or vomiting. 04/20/17   Ashley Murrain, NP    Family History Family History  Problem Relation Age of Onset  . Hypertension Mother   . Hyperlipidemia Mother   . Hyperlipidemia Father   . Hypertension Father   . Heart attack Maternal Grandfather   . Cancer Paternal Grandfather     Social History Social History  Substance Use Topics  . Smoking status: Never Smoker  . Smokeless tobacco: Never Used  . Alcohol use No     Allergies   Patient has no known allergies.   Review of Systems Review of  Systems  Constitutional: Negative for fever and weight loss.  HENT: Negative for ear pain, hearing loss, rhinorrhea, sinus pressure and sore throat.   Eyes: Positive for blurred vision and photophobia. Negative for pain and redness.  Respiratory: Negative for cough and shortness of breath.   Cardiovascular: Positive for palpitations. Negative for chest pain.  Gastrointestinal: Positive for nausea and vomiting. Negative for abdominal pain and anorexia.  Genitourinary: Negative for dysuria, frequency, vaginal bleeding and vaginal discharge.  Musculoskeletal: Negative for back pain and neck pain.  Skin: Negative for rash.  Neurological: Positive for dizziness and headaches. Negative for syncope and loss of balance.  Psychiatric/Behavioral: Negative for confusion.     Physical Exam Updated Vital Signs BP 137/87   Pulse 84   Temp 98.3 F (36.8 C) (Oral)   Resp 16   Wt 184 lb 8 oz (83.7 kg)   LMP 06/29/2016   SpO2 99%   BMI 32.17 kg/m   Physical Exam  Constitutional: She is oriented to person, place, and time. She appears well-developed and well-nourished. No distress.  HENT:  Head: Normocephalic and atraumatic.  Right Ear: Tympanic membrane normal.  Left Ear: Tympanic membrane normal.  Nose: Nose normal.  Mouth/Throat: Uvula is midline, oropharynx is clear and moist and mucous membranes are normal.  Eyes: Conjunctivae and EOM are normal.  Neck: Normal range of motion. Neck supple.  No meningeal signs.  Cardiovascular: Normal rate and regular rhythm.   Pulmonary/Chest: Effort normal. No respiratory distress. She has no wheezes. She has no rales.  Abdominal: Soft. Bowel sounds are normal. She exhibits no mass. There is no tenderness.  Positive FHT. Gravid @ [redacted] weeks gestation  Musculoskeletal: Normal range of motion. She exhibits no edema.  Radial and pedal pulses strong, adequate circulation, good touch sensation.  Neurological: She is alert and oriented to person, place, and  time. She has normal strength. No cranial nerve deficit or sensory deficit. She displays a negative Romberg sign. Gait normal.  Reflex Scores:      Bicep reflexes are 2+ on the right side and 2+ on the left side.      Brachioradialis reflexes are 2+ on the right side and 2+ on the left side.      Patellar reflexes are 2+ on the right side and 2+ on the left side. Rapid alternating movement without difficulty. Stands on one foot without difficulty.steady gait no foot drag  Skin: Skin is warm and dry.  Psychiatric: She has a normal mood and affect. Her behavior is normal.  7:10 pm Patient feeling better after treatment in MAU, nausea has resolved, headache is better.  Consult with Dr. Gaetano Net. Will treat as viral GI illness and d/c home with medication for nausea. Will no start medication for HTN unless BP remains elevated at next OB visit.   ED Treatments / Results  Labs (all labs ordered are listed, but only abnormal results are displayed) Labs Reviewed  CBC WITH DIFFERENTIAL/PLATELET - Abnormal; Notable for the following:       Result Value   WBC 12.8 (*)    HCT 35.4 (*)    Neutro Abs 9.6 (*)    All other components within normal limits  COMPREHENSIVE METABOLIC PANEL - Abnormal; Notable for the following:    Potassium 3.4 (*)    Albumin 3.1 (*)    ALT 13 (*)    All other components within normal limits  URINALYSIS, ROUTINE W REFLEX MICROSCOPIC    EKG: NSR, normal EKG  Radiology No results found.  Procedures Procedures (including critical care time)  Medications Ordered in ED Medications  lactated ringers infusion ( Intravenous Stopped 04/20/17 1925)  ondansetron (ZOFRAN) injection 4 mg (4 mg Intravenous Given 04/20/17 1742)  acetaminophen (TYLENOL) tablet 650 mg (650 mg Oral Given 04/20/17 1917)     Initial Impression / Assessment and Plan / ED Course  I have reviewed the triage vital signs and the nursing notes.  Pertinent labs & imaging results that were available  during my care of the patient were reviewed by me and considered in my medical decision making (see chart for details).   Final Clinical Impressions(s) / ED Diagnoses  31 y.o. [id number] @ [redacted]w[redacted]d with n/v/d and headache stable for d/c without severe dehydration and appears safe for d/c without meningeal signs. BP slightly elevated, no extremity edema, normal neuro exam and normal EKG. Lab results reviewed and results discussed with the patient as well as clinical and EKG results. Will Rx Zofran and patient will take tylenol as needed for headache. Return precautions discussed and patient agrees with plan.   Final diagnoses:  Gastroenteritis  Near syncope  Nausea and vomiting in pregnancy prior to [redacted] weeks gestation    New Prescriptions Current Discharge Medication List    START taking these medications   Details  ondansetron (ZOFRAN ODT) 4 MG disintegrating tablet Take 1 tablet (4 mg total) by mouth every 8 (eight) hours as needed for nausea or vomiting. Qty: 20 tablet, Refills: 0

## 2017-04-20 NOTE — MAU Note (Signed)
From Sunday to Wed, was very nauseous and couldn't even keep down water.  Yesterday did pretty good at keeping stuff down.  Only threw up once.  Today doesn't feel good, feels weak, terrible headache, nausea, palpitations.

## 2017-04-21 ENCOUNTER — Encounter (HOSPITAL_COMMUNITY): Payer: Self-pay | Admitting: *Deleted

## 2017-04-21 ENCOUNTER — Inpatient Hospital Stay (HOSPITAL_COMMUNITY)
Admission: AD | Admit: 2017-04-21 | Discharge: 2017-04-21 | Disposition: A | Discharge status: Home / Self Care | Payer: 59 | Source: Ambulatory Visit | Attending: Obstetrics and Gynecology | Admitting: Obstetrics and Gynecology

## 2017-04-21 DIAGNOSIS — Z3A20 20 weeks gestation of pregnancy: Secondary | ICD-10-CM | POA: Insufficient documentation

## 2017-04-21 DIAGNOSIS — R51 Headache: Secondary | ICD-10-CM | POA: Diagnosis not present

## 2017-04-21 DIAGNOSIS — Z8249 Family history of ischemic heart disease and other diseases of the circulatory system: Secondary | ICD-10-CM | POA: Insufficient documentation

## 2017-04-21 DIAGNOSIS — O162 Unspecified maternal hypertension, second trimester: Secondary | ICD-10-CM | POA: Insufficient documentation

## 2017-04-21 LAB — PROTEIN / CREATININE RATIO, URINE
Creatinine, Urine: 82 mg/dL
Protein Creatinine Ratio: 0.1 mg/mg{Cre} (ref 0.00–0.15)
Total Protein, Urine: 8 mg/dL

## 2017-04-21 MED ORDER — LABETALOL HCL 100 MG PO TABS: 100.0000 mg | ORAL_TABLET | Freq: Two times a day (BID) | ORAL | 0 refills | Status: DC

## 2017-04-21 NOTE — MAU Note (Signed)
Pt reports she awakened with a really bad headache this am and her b/p was 147/97, took some tylenol for the headache and went back to sleep. B/p was still elevated when she got up and called the office , was told to come here.

## 2017-04-21 NOTE — MAU Provider Note (Signed)
History     CSN: [id number]  Arrival date and time: 04/21/17 1244   First Provider Initiated Contact with Patient 04/21/17 1345      Chief Complaint  Patient presents with  . Headache  . Hypertension   G3P2002 @20 .1 wks here with HA and elevated BP. HA started this am, top of her head. She took Tylenol and had good relief. Was seeing floaters at that time but not now. BP early this am at home was 140/90s. Denies CP and SOB. Reports good FM. No VB or abd pain.  Hx of GHTN in prior 2 pregnancies.   OB History    Gravida Para Term Preterm AB Living   3 2 2     2    SAB TAB Ectopic Multiple Live Births           2      Past Medical History:  Diagnosis Date  . Gestational hypertension     Past Surgical History:  Procedure Laterality Date  . NO PAST SURGERIES      Family History  Problem Relation Age of Onset  . Hypertension Mother   . Hyperlipidemia Mother   . Hyperlipidemia Father   . Hypertension Father   . Heart attack Maternal Grandfather   . Cancer Paternal Grandfather     Social History  Substance Use Topics  . Smoking status: Never Smoker  . Smokeless tobacco: Never Used  . Alcohol use No    Allergies: No Known Allergies  Prescriptions Prior to Admission  Medication Sig Dispense Refill Last Dose  . acetaminophen (TYLENOL) 325 MG tablet Take 325 mg by mouth every 6 (six) hours as needed for headache.   04/21/2017 at 1200  . calcium carbonate (TUMS - DOSED IN MG ELEMENTAL CALCIUM) 500 MG chewable tablet Chew 1 tablet by mouth 4 (four) times daily as needed for indigestion or heartburn.   04/20/2017 at Unknown time  . Doxylamine-Pyridoxine (DICLEGIS) 10-10 MG TBEC Take 1 tablet by mouth at bedtime.   04/19/2017 at Unknown time  . ondansetron (ZOFRAN ODT) 4 MG disintegrating tablet Take 1 tablet (4 mg total) by mouth every 8 (eight) hours as needed for nausea or vomiting. 20 tablet 0   . Prenatal Vit-Fe Fumarate-FA (PRENATAL MULTIVITAMIN) TABS tablet Take 1  tablet by mouth at bedtime.   04/19/2017 at Unknown time  . ranitidine (ZANTAC) 150 MG tablet Take 150 mg by mouth at bedtime.   04/19/2017 at Unknown time    Review of Systems  Eyes: Negative for visual disturbance.  Respiratory: Negative for shortness of breath.   Cardiovascular: Negative for chest pain.  Gastrointestinal: Negative for abdominal pain.  Genitourinary: Negative for vaginal bleeding.  Neurological: Positive for headaches.   Physical Exam   Blood pressure 131/78, pulse (!) 101, temperature 98.6 F (37 C), temperature source Oral, resp. rate 18, last menstrual period 06/29/2016, currently breastfeeding.  Patient Vitals for the past 24 hrs:  BP Temp Temp src Pulse Resp  04/21/17 1401 122/71 - - 94 -  04/21/17 1346 127/77 - - 98 -  04/21/17 1316 131/78 - - (!) 101 -  04/21/17 1315 - 98.6 F (37 C) Oral - -  04/21/17 1301 131/83 - - 95 -  04/21/17 1256 139/84 - - 94 18    Physical Exam  Constitutional: She is oriented to person, place, and time. She appears well-developed and well-nourished. No distress.  HENT:  Head: Normocephalic and atraumatic.  Neck: Normal range of motion.  Respiratory: Effort  normal. No respiratory distress.  Musculoskeletal: Normal range of motion. She exhibits no edema.  Neurological: She is alert and oriented to person, place, and time.  Skin: Skin is warm and dry.  Psychiatric: She has a normal mood and affect.  FHT 148 bpm  Results for orders placed or performed during the hospital encounter of 04/21/17 (from the past 72 hour(s))  Protein / creatinine ratio, urine     Status: None   Collection Time: 04/21/17 12:50 PM  Result Value Ref Range   Creatinine, Urine 82.00 mg/dL   Total Protein, Urine 8 mg/dL    Comment: NO NORMAL RANGE ESTABLISHED FOR THIS TEST   Protein Creatinine Ratio 0.10 0.00 - 0.15 mg/mg[Cre]   MAU Course  Procedures  MDM Labs ordered and reviewed. Normal CMP and CBC yesterday- not repeated. PCR nml.  Presentation, clinical findings, and plan discussed with Dr. Gaetano Net. Will start Labetalol. Stable for discharge home.  Assessment and Plan   1. [redacted] weeks gestation of pregnancy   2. Hypertension affecting pregnancy in second trimester    Discharge home Follow up in OB office in 2 days for BP check OOW until 04/23/17 Return precautions discussed Rx Labetalol  Allergies as of 04/21/2017   No Known Allergies     Medication List    TAKE these medications   acetaminophen 325 MG tablet Commonly known as:  TYLENOL Take 325 mg by mouth every 6 (six) hours as needed for headache.   calcium carbonate 500 MG chewable tablet Commonly known as:  TUMS - dosed in mg elemental calcium Chew 1 tablet by mouth 4 (four) times daily as needed for indigestion or heartburn.   DICLEGIS 10-10 MG Tbec Generic drug:  Doxylamine-Pyridoxine Take 1 tablet by mouth at bedtime.   labetalol 100 MG tablet Commonly known as:  NORMODYNE Take 1 tablet (100 mg total) by mouth 2 (two) times daily.   ondansetron 4 MG disintegrating tablet Commonly known as:  ZOFRAN ODT Take 1 tablet (4 mg total) by mouth every 8 (eight) hours as needed for nausea or vomiting.   prenatal multivitamin Tabs tablet Take 1 tablet by mouth at bedtime.   ranitidine 150 MG tablet Commonly known as:  ZANTAC Take 150 mg by mouth at bedtime.      Julianne Handler, CNM 04/21/2017, 1:52 PM

## 2017-04-21 NOTE — Discharge Instructions (Signed)
Hypertension During Pregnancy °Hypertension, commonly called high blood pressure, is when the force of blood pumping through your arteries is too strong. Arteries are blood vessels that carry blood from the heart throughout the body. Hypertension during pregnancy can cause problems for you and your baby. Your baby may be born early (prematurely) or may not weigh as much as he or she should at birth. Very bad cases of hypertension during pregnancy can be life-threatening. °Different types of hypertension can occur during pregnancy. These include: °· Chronic hypertension. This happens when: °? You have hypertension before pregnancy and it continues during pregnancy. °? You develop hypertension before you are [redacted] weeks pregnant, and it continues during pregnancy. °· Gestational hypertension. This is hypertension that develops after the 20th week of pregnancy. °· Preeclampsia, also called toxemia of pregnancy. This is a very serious type of hypertension that develops only during pregnancy. It affects the whole body, and it can be very dangerous for you and your baby. ° °Gestational hypertension and preeclampsia usually go away within 6 weeks after your baby is born. Women who have hypertension during pregnancy have a greater chance of developing hypertension later in life or during future pregnancies. °What are the causes? °The exact cause of hypertension is not known. °What increases the risk? °There are certain factors that make it more likely for you to develop hypertension during pregnancy. These include: °· Having hypertension during a previous pregnancy or prior to pregnancy. °· Being overweight. °· Being older than age 40. °· Being pregnant for the first time or being pregnant with more than one baby. °· Becoming pregnant using fertilization methods such as IVF (in vitro fertilization). °· Having diabetes, kidney problems, or systemic lupus erythematosus. °· Having a family history of hypertension. ° °What are the  signs or symptoms? °Chronic hypertension and gestational hypertension rarely cause symptoms. Preeclampsia causes symptoms, which may include: °· Increased protein in your urine. Your health care provider will check for this at every visit before you give birth (prenatal visit). °· Severe headaches. °· Sudden weight gain. °· Swelling of the hands, face, legs, and feet. °· Nausea and vomiting. °· Vision problems, such as blurred or double vision. °· Numbness in the face, arms, legs, and feet. °· Dizziness. °· Slurred speech. °· Sensitivity to bright lights. °· Abdominal pain. °· Convulsions. ° °How is this diagnosed? °You may be diagnosed with hypertension during a routine prenatal exam. At each prenatal visit, you may: °· Have a urine test to check for high amounts of protein in your urine. °· Have your blood pressure checked. A blood pressure reading is recorded as two numbers, such as "120 over 80" (or 120/80). The first ("top") number is called the systolic pressure. It is a measure of the pressure in your arteries when your heart beats. The second ("bottom") number is called the diastolic pressure. It is a measure of the pressure in your arteries as your heart relaxes between beats. Blood pressure is measured in a unit called mm Hg. A normal blood pressure reading is: °? Systolic: below 120. °? Diastolic: below 80. ° °The type of hypertension that you are diagnosed with depends on your test results and when your symptoms developed. °· Chronic hypertension is usually diagnosed before 20 weeks of pregnancy. °· Gestational hypertension is usually diagnosed after 20 weeks of pregnancy. °· Hypertension with high amounts of protein in the urine is diagnosed as preeclampsia. °· Blood pressure measurements that stay above 160 systolic, or above 110 diastolic, are   signs of severe preeclampsia. ° °How is this treated? °Treatment for hypertension during pregnancy varies depending on the type of hypertension you have and how  serious it is. °· If you take medicines called ACE inhibitors to treat chronic hypertension, you may need to switch medicines. ACE inhibitors should not be taken during pregnancy. °· If you have gestational hypertension, you may need to take blood pressure medicine. °· If you are at risk for preeclampsia, your health care provider may recommend that you take a low-dose aspirin every day to prevent high blood pressure during your pregnancy. °· If you have severe preeclampsia, you may need to be hospitalized so you and your baby can be monitored closely. You may also need to take medicine (magnesium sulfate) to prevent seizures and to lower blood pressure. This medicine may be given as an injection or through an IV tube. °· In some cases, if your condition gets worse, you may need to deliver your baby early. ° °Follow these instructions at home: °Eating and drinking °· Drink enough fluid to keep your urine clear or pale yellow. °· Eat a healthy diet that is low in salt (sodium). Do not add salt to your food. Check food labels to see how much sodium a food or beverage contains. °Lifestyle °· Do not use any products that contain nicotine or tobacco, such as cigarettes and e-cigarettes. If you need help quitting, ask your health care provider. °· Do not use alcohol. °· Avoid caffeine. °· Avoid stress as much as possible. Rest and get plenty of sleep. °General instructions °· Take over-the-counter and prescription medicines only as told by your health care provider. °· While lying down, lie on your left side. This keeps pressure off your baby. °· While sitting or lying down, raise (elevate) your feet. Try putting some pillows under your lower legs. °· Exercise regularly. Ask your health care provider what kinds of exercise are best for you. °· Keep all prenatal and follow-up visits as told by your health care provider. This is important. °Contact a health care provider if: °· You have symptoms that your health care  provider told you may require more treatment or monitoring, such as: °? Fever. °? Vomiting. °? Headache. °Get help right away if: °· You have severe abdominal pain or vomiting that does not get better with treatment. °· You suddenly develop swelling in your hands, ankles, or face. °· You gain 4 lbs (1.8 kg) or more in 1 week. °· You develop vaginal bleeding, or you have blood in your urine. °· You do not feel your baby moving as much as usual. °· You have blurred or double vision. °· You have muscle twitching or sudden tightening (spasms). °· You have shortness of breath. °· Your lips or fingernails turn blue. °This information is not intended to replace advice given to you by your health care provider. Make sure you discuss any questions you have with your health care provider. °Document Released: 05/16/2011 Document Revised: 03/17/2016 Document Reviewed: 02/11/2016 °Elsevier Interactive Patient Education © 2018 [employer] ° °

## 2017-05-07 ENCOUNTER — Inpatient Hospital Stay (HOSPITAL_COMMUNITY)
Admission: AD | Admit: 2017-05-07 | Discharge: 2017-05-08 | Disposition: A | Discharge status: Home / Self Care | Payer: 59 | Source: Ambulatory Visit | Attending: Obstetrics and Gynecology | Admitting: Obstetrics and Gynecology

## 2017-05-07 ENCOUNTER — Encounter (HOSPITAL_COMMUNITY): Payer: Self-pay | Admitting: *Deleted

## 2017-05-07 DIAGNOSIS — O132 Gestational [pregnancy-induced] hypertension without significant proteinuria, second trimester: Secondary | ICD-10-CM | POA: Diagnosis not present

## 2017-05-07 DIAGNOSIS — Z3A22 22 weeks gestation of pregnancy: Secondary | ICD-10-CM | POA: Diagnosis not present

## 2017-05-07 DIAGNOSIS — I10 Essential (primary) hypertension: Secondary | ICD-10-CM | POA: Diagnosis present

## 2017-05-07 DIAGNOSIS — O139 Gestational [pregnancy-induced] hypertension without significant proteinuria, unspecified trimester: Secondary | ICD-10-CM | POA: Diagnosis present

## 2017-05-07 DIAGNOSIS — O26892 Other specified pregnancy related conditions, second trimester: Secondary | ICD-10-CM | POA: Diagnosis not present

## 2017-05-07 DIAGNOSIS — E876 Hypokalemia: Secondary | ICD-10-CM | POA: Insufficient documentation

## 2017-05-07 LAB — CBC
HCT: 32.7 % — ABNORMAL LOW (ref 36.0–46.0)
Hemoglobin: 11.3 g/dL — ABNORMAL LOW (ref 12.0–15.0)
MCH: 31.5 pg (ref 26.0–34.0)
MCHC: 34.6 g/dL (ref 30.0–36.0)
MCV: 91.1 fL (ref 78.0–100.0)
PLATELETS: 264 10*3/uL (ref 150–400)
RBC: 3.59 MIL/uL — ABNORMAL LOW (ref 3.87–5.11)
RDW: 12.9 % (ref 11.5–15.5)
WBC: 14 10*3/uL — AB (ref 4.0–10.5)

## 2017-05-07 LAB — URINALYSIS, ROUTINE W REFLEX MICROSCOPIC
Bilirubin Urine: NEGATIVE
Glucose, UA: NEGATIVE mg/dL
HGB URINE DIPSTICK: NEGATIVE
KETONES UR: NEGATIVE mg/dL
Leukocytes, UA: NEGATIVE
Nitrite: NEGATIVE
Protein, ur: NEGATIVE mg/dL
SPECIFIC GRAVITY, URINE: 1.005 (ref 1.005–1.030)
pH: 7 (ref 5.0–8.0)

## 2017-05-07 MED ORDER — ACETAMINOPHEN 500 MG PO TABS
1000.0000 mg | ORAL_TABLET | Freq: Once | ORAL | Status: AC
  Administered 2017-05-08: 1000 mg via ORAL
  Filled 2017-05-07: qty 2

## 2017-05-07 NOTE — MAU Note (Signed)
Pt here with c/o high BP, monitored for about an hour at work [facility]) and was 150/100s. Nauseated and shortness of breath. Denies any bleeding or leaking of fluid. Report good fetal movement.

## 2017-05-07 NOTE — MAU Provider Note (Signed)
History     CSN: [id number]  Arrival date and time: 05/07/17 2228   First Provider Initiated Contact with Patient 05/07/17 2334      Chief Complaint  Patient presents with  . Hypertension   HPI  [name] 31 yo G3P2002 at 22.[redacted] wks gestation presenting to MAU with complaints of elevated BPs x 1 hour while at work (she works in Aeronautical engineer at [employer]), H/A, floaters, and nausea.  She was seen on 8/11 for the same complaints.  She was Rx'd Labetalol 100 mg BID.  She reports the medication was lowering her BP too low and Dr. Julien Girt changed her to taking the Labetalol once daily.  She reports she only has spiking of her BP while at work.  Now that she is here her BP is improving.  Past Medical History:  Diagnosis Date  . Gestational hypertension   . Gestational hypertension 05/08/2017    Past Surgical History:  Procedure Laterality Date  . NO PAST SURGERIES    . WISDOM TOOTH EXTRACTION      Family History  Problem Relation Age of Onset  . Hypertension Mother   . Hyperlipidemia Mother   . Hyperlipidemia Father   . Hypertension Father   . Heart attack Maternal Grandfather   . Cancer Paternal Grandfather     Social History  Substance Use Topics  . Smoking status: Never Smoker  . Smokeless tobacco: Never Used  . Alcohol use No    Allergies: No Known Allergies  Prescriptions Prior to Admission  Medication Sig Dispense Refill Last Dose  . acetaminophen (TYLENOL) 325 MG tablet Take 325 mg by mouth every 6 (six) hours as needed for headache.   Past Week at Unknown time  . calcium carbonate (TUMS - DOSED IN MG ELEMENTAL CALCIUM) 500 MG chewable tablet Chew 1 tablet by mouth 4 (four) times daily as needed for indigestion or heartburn.   05/07/2017 at Unknown time  . Doxylamine-Pyridoxine (DICLEGIS) 10-10 MG TBEC Take 1 tablet by mouth at bedtime.   Past Month at Unknown time  . Prenatal Vit-Fe Fumarate-FA (PRENATAL MULTIVITAMIN) TABS tablet Take 1 tablet by mouth at  bedtime.   05/07/2017 at Unknown time  . ranitidine (ZANTAC) 150 MG tablet Take 150 mg by mouth at bedtime.   05/07/2017 at Unknown time  . [DISCONTINUED] labetalol (NORMODYNE) 100 MG tablet Take 1 tablet (100 mg total) by mouth 2 (two) times daily. 60 tablet 0 05/07/2017 at Unknown time  . ondansetron (ZOFRAN ODT) 4 MG disintegrating tablet Take 1 tablet (4 mg total) by mouth every 8 (eight) hours as needed for nausea or vomiting. 20 tablet 0 More than a month at Unknown time    Review of Systems  Constitutional: Negative.   HENT: Negative.        "seeing floaters"  Eyes: Negative.   Respiratory: Negative.   Cardiovascular:       Elevated BPs 150s/100s x 1 hr while at work (works at [employer])  Gastrointestinal: Positive for nausea.  Endocrine: Negative.   Genitourinary: Negative.   Musculoskeletal: Negative.   Skin: Negative.   Neurological: Positive for headaches.  Hematological: Negative.   Psychiatric/Behavioral: Negative.    Physical Exam   Blood pressure 132/77, pulse 79, temperature 98.9 F (37.2 C), resp. rate 16, height 5\' 3"  (1.6 m), weight 85.7 kg (189 lb), last menstrual period 06/29/2016, SpO2 99 %, currently breastfeeding.  Physical Exam  Constitutional: She is oriented to person, place, and time. She appears well-developed  and well-nourished.  HENT:  Head: Normocephalic.  Eyes: Pupils are equal, round, and reactive to light.  Neck: Normal range of motion.  Cardiovascular: Normal rate, regular rhythm and normal heart sounds.   Respiratory: Effort normal and breath sounds normal.  GI: Soft. Bowel sounds are normal.  Genitourinary:  Genitourinary Comments: Pelvic deferred  Musculoskeletal: Normal range of motion.  Neurological: She is alert and oriented to person, place, and time. She has normal reflexes.  Skin: Skin is warm and dry.  Psychiatric: She has a normal mood and affect. Her behavior is normal. Judgment and thought content normal.    FHTs by  doppler = 145 bpm  MAU Course  Procedures  MDM CCUA CBC CMP P/C ratio Tylenol -- improved H/A  *Consult with Dr. Julien Girt @ [phone] - notified of patient's complaints, assessments, lab results, recommended tx plan d/c home, f/u in office later this week, Rx KDur 20 mEq BID x 3 days   Results for orders placed or performed during the hospital encounter of 05/07/17 (from the past 24 hour(s))  Urinalysis, Routine w reflex microscopic     Status: Abnormal   Collection Time: 05/07/17 10:50 PM  Result Value Ref Range   Color, Urine STRAW (A) YELLOW   APPearance CLEAR CLEAR   Specific Gravity, Urine 1.005 1.005 - 1.030   pH 7.0 5.0 - 8.0   Glucose, UA NEGATIVE NEGATIVE mg/dL   Hgb urine dipstick NEGATIVE NEGATIVE   Bilirubin Urine NEGATIVE NEGATIVE   Ketones, ur NEGATIVE NEGATIVE mg/dL   Protein, ur NEGATIVE NEGATIVE mg/dL   Nitrite NEGATIVE NEGATIVE   Leukocytes, UA NEGATIVE NEGATIVE  Protein / creatinine ratio, urine     Status: None   Collection Time: 05/07/17 10:50 PM  Result Value Ref Range   Creatinine, Urine 41.00 mg/dL   Total Protein, Urine <6 mg/dL   Protein Creatinine Ratio        0.00 - 0.15 mg/mg[Cre]  CBC     Status: Abnormal   Collection Time: 05/07/17 11:33 PM  Result Value Ref Range   WBC 14.0 (H) 4.0 - 10.5 K/uL   RBC 3.59 (L) 3.87 - 5.11 MIL/uL   Hemoglobin 11.3 (L) 12.0 - 15.0 g/dL   HCT 32.7 (L) 36.0 - 46.0 %   MCV 91.1 78.0 - 100.0 fL   MCH 31.5 26.0 - 34.0 pg   MCHC 34.6 30.0 - 36.0 g/dL   RDW 12.9 11.5 - 15.5 %   Platelets 264 150 - 400 K/uL  Comprehensive metabolic panel     Status: Abnormal   Collection Time: 05/07/17 11:33 PM  Result Value Ref Range   Sodium 138 135 - 145 mmol/L   Potassium 3.1 (L) 3.5 - 5.1 mmol/L   Chloride 103 101 - 111 mmol/L   CO2 25 22 - 32 mmol/L   Glucose, Bld 87 65 - 99 mg/dL   BUN 7 6 - 20 mg/dL   Creatinine, Ser 0.55 0.44 - 1.00 mg/dL   Calcium 9.1 8.9 - 10.3 mg/dL   Total Protein 6.8 6.5 - 8.1 g/dL   Albumin 3.1  (L) 3.5 - 5.0 g/dL   AST 19 15 - 41 U/L   ALT 15 14 - 54 U/L   Alkaline Phosphatase 83 38 - 126 U/L   Total Bilirubin 0.6 0.3 - 1.2 mg/dL   GFR calc non Af Amer >60 >60 mL/min   GFR calc Af Amer >60 >60 mL/min   Anion gap 10 5 - 15  Assessment and Plan  Pregnancy-induced hypertension in second trimester - Continue with Labetalol 100 mg PO daily - F/U in the office later this week -- call to schedule  Hypokalemia - Rx K-Dur 20 mEq PO BID x 3 days  Discharge home Patient verbalized an understanding of the plan of care and agrees.   Laury Deep, MSN, CNM 05/08/2017, 11:55 PM

## 2017-05-08 ENCOUNTER — Encounter (HOSPITAL_COMMUNITY): Payer: Self-pay | Admitting: Obstetrics and Gynecology

## 2017-05-08 DIAGNOSIS — O139 Gestational [pregnancy-induced] hypertension without significant proteinuria, unspecified trimester: Secondary | ICD-10-CM

## 2017-05-08 HISTORY — DX: Gestational (pregnancy-induced) hypertension without significant proteinuria, unspecified trimester: O13.9

## 2017-05-08 LAB — COMPREHENSIVE METABOLIC PANEL
ALBUMIN: 3.1 g/dL — AB (ref 3.5–5.0)
ALT: 15 U/L (ref 14–54)
AST: 19 U/L (ref 15–41)
Alkaline Phosphatase: 83 U/L (ref 38–126)
Anion gap: 10 (ref 5–15)
BUN: 7 mg/dL (ref 6–20)
CALCIUM: 9.1 mg/dL (ref 8.9–10.3)
CO2: 25 mmol/L (ref 22–32)
Chloride: 103 mmol/L (ref 101–111)
Creatinine, Ser: 0.55 mg/dL (ref 0.44–1.00)
GFR calc Af Amer: >60 mL/min (ref >60)
Glucose, Bld: 87 mg/dL (ref 65–99)
POTASSIUM: 3.1 mmol/L — AB (ref 3.5–5.1)
SODIUM: 138 mmol/L (ref 135–145)
TOTAL PROTEIN: 6.8 g/dL (ref 6.5–8.1)
Total Bilirubin: 0.6 mg/dL (ref 0.3–1.2)

## 2017-05-08 LAB — PROTEIN / CREATININE RATIO, URINE
CREATININE, URINE: 41 mg/dL
Total Protein, Urine: <6 mg/dL

## 2017-05-08 MED ORDER — LABETALOL HCL 100 MG PO TABS: 100.0000 mg | ORAL_TABLET | Freq: Once | ORAL | 0 refills | Status: DC

## 2017-05-08 MED ORDER — POTASSIUM CHLORIDE CRYS ER 20 MEQ PO TBCR: 20.0000 meq | EXTENDED_RELEASE_TABLET | Freq: Two times a day (BID) | ORAL | 0 refills | Status: DC

## 2017-05-10 DIAGNOSIS — Z3492 Encounter for supervision of normal pregnancy, unspecified, second trimester: Secondary | ICD-10-CM | POA: Diagnosis not present

## 2017-05-10 DIAGNOSIS — Z3A22 22 weeks gestation of pregnancy: Secondary | ICD-10-CM | POA: Diagnosis not present

## 2017-05-10 DIAGNOSIS — E876 Hypokalemia: Secondary | ICD-10-CM | POA: Diagnosis not present

## 2017-05-10 DIAGNOSIS — Z362 Encounter for other antenatal screening follow-up: Secondary | ICD-10-CM | POA: Diagnosis not present

## 2017-05-10 DIAGNOSIS — Z3A18 18 weeks gestation of pregnancy: Secondary | ICD-10-CM | POA: Diagnosis not present

## 2017-05-18 ENCOUNTER — Other Ambulatory Visit: Payer: Self-pay | Admitting: Certified Nurse Midwife

## 2017-05-26 ENCOUNTER — Inpatient Hospital Stay (HOSPITAL_COMMUNITY)
Admission: AD | Admit: 2017-05-26 | Discharge: 2017-05-26 | Disposition: A | Discharge status: Home / Self Care | Payer: 59 | Source: Ambulatory Visit | Attending: Obstetrics and Gynecology | Admitting: Obstetrics and Gynecology

## 2017-05-26 ENCOUNTER — Encounter (HOSPITAL_COMMUNITY): Payer: Self-pay | Admitting: *Deleted

## 2017-05-26 DIAGNOSIS — O0942 Supervision of pregnancy with grand multiparity, second trimester: Secondary | ICD-10-CM | POA: Diagnosis not present

## 2017-05-26 DIAGNOSIS — Z8249 Family history of ischemic heart disease and other diseases of the circulatory system: Secondary | ICD-10-CM | POA: Diagnosis not present

## 2017-05-26 DIAGNOSIS — O139 Gestational [pregnancy-induced] hypertension without significant proteinuria, unspecified trimester: Secondary | ICD-10-CM

## 2017-05-26 DIAGNOSIS — O162 Unspecified maternal hypertension, second trimester: Secondary | ICD-10-CM | POA: Insufficient documentation

## 2017-05-26 DIAGNOSIS — O132 Gestational [pregnancy-induced] hypertension without significant proteinuria, second trimester: Secondary | ICD-10-CM

## 2017-05-26 DIAGNOSIS — Z809 Family history of malignant neoplasm, unspecified: Secondary | ICD-10-CM | POA: Diagnosis not present

## 2017-05-26 DIAGNOSIS — O26892 Other specified pregnancy related conditions, second trimester: Secondary | ICD-10-CM | POA: Insufficient documentation

## 2017-05-26 DIAGNOSIS — Z3A25 25 weeks gestation of pregnancy: Secondary | ICD-10-CM | POA: Diagnosis not present

## 2017-05-26 DIAGNOSIS — R51 Headache: Secondary | ICD-10-CM | POA: Insufficient documentation

## 2017-05-26 LAB — COMPREHENSIVE METABOLIC PANEL
ALT: 14 U/L (ref 14–54)
ANION GAP: 8 (ref 5–15)
AST: 22 U/L (ref 15–41)
Albumin: 2.9 g/dL — ABNORMAL LOW (ref 3.5–5.0)
Alkaline Phosphatase: 82 U/L (ref 38–126)
BILIRUBIN TOTAL: 0.4 mg/dL (ref 0.3–1.2)
BUN: 8 mg/dL (ref 6–20)
CHLORIDE: 104 mmol/L (ref 101–111)
CO2: 23 mmol/L (ref 22–32)
Calcium: 9.3 mg/dL (ref 8.9–10.3)
Creatinine, Ser: 0.6 mg/dL (ref 0.44–1.00)
Glucose, Bld: 95 mg/dL (ref 65–99)
POTASSIUM: 3.6 mmol/L (ref 3.5–5.1)
Sodium: 135 mmol/L (ref 135–145)
TOTAL PROTEIN: 6.6 g/dL (ref 6.5–8.1)

## 2017-05-26 LAB — PROTEIN / CREATININE RATIO, URINE
CREATININE, URINE: 102 mg/dL
Protein Creatinine Ratio: 0.12 mg/mg{Cre} (ref 0.00–0.15)
TOTAL PROTEIN, URINE: 12 mg/dL

## 2017-05-26 LAB — CBC WITH DIFFERENTIAL/PLATELET
BASOS ABS: 0.1 10*3/uL (ref 0.0–0.1)
Basophils Relative: 0 %
EOS PCT: 1 %
Eosinophils Absolute: 0.1 10*3/uL (ref 0.0–0.7)
HEMATOCRIT: 32.4 % — AB (ref 36.0–46.0)
Hemoglobin: 11.2 g/dL — ABNORMAL LOW (ref 12.0–15.0)
LYMPHS PCT: 18 %
Lymphs Abs: 2.6 10*3/uL (ref 0.7–4.0)
MCH: 31.9 pg (ref 26.0–34.0)
MCHC: 34.6 g/dL (ref 30.0–36.0)
MCV: 92.3 fL (ref 78.0–100.0)
MONO ABS: 0.5 10*3/uL (ref 0.1–1.0)
MONOS PCT: 3 %
Neutro Abs: 11.3 10*3/uL — ABNORMAL HIGH (ref 1.7–7.7)
Neutrophils Relative %: 78 %
PLATELETS: 273 10*3/uL (ref 150–400)
RBC: 3.51 MIL/uL — ABNORMAL LOW (ref 3.87–5.11)
RDW: 13.3 % (ref 11.5–15.5)
WBC: 14.5 10*3/uL — ABNORMAL HIGH (ref 4.0–10.5)

## 2017-05-26 MED ORDER — BUTALBITAL-APAP-CAFFEINE 50-325-40 MG PO TABS
2.0000 | ORAL_TABLET | Freq: Once | ORAL | Status: AC
  Administered 2017-05-26: 2 via ORAL
  Filled 2017-05-26: qty 2

## 2017-05-26 NOTE — MAU Provider Note (Signed)
History   G3P2002 @ 25.1 wks in with c/o headache that started today and elevated BP. States taking labetalol 100 bid for her GHTN.  CSN: [id number]  Arrival date & time 05/26/17  1609   None     Chief Complaint  Patient presents with  . Hypertension    HPI  Past Medical History:  Diagnosis Date  . Gestational hypertension   . Gestational hypertension 05/08/2017    Past Surgical History:  Procedure Laterality Date  . NO PAST SURGERIES    . WISDOM TOOTH EXTRACTION      Family History  Problem Relation Age of Onset  . Hypertension Mother   . Hyperlipidemia Mother   . Hyperlipidemia Father   . Hypertension Father   . Heart attack Maternal Grandfather   . Cancer Paternal Grandfather     Social History  Substance Use Topics  . Smoking status: Never Smoker  . Smokeless tobacco: Never Used  . Alcohol use No    OB History    Gravida Para Term Preterm AB Living   3 2 2     2    SAB TAB Ectopic Multiple Live Births           2      Review of Systems  Constitutional: Negative.   HENT: Negative.   Eyes: Negative.   Respiratory: Negative.   Cardiovascular: Negative.   Gastrointestinal: Negative.   Endocrine: Negative.   Genitourinary: Negative.   Musculoskeletal: Negative.   Skin: Negative.   Allergic/Immunologic: Negative.   Neurological: Positive for headaches.  Hematological: Negative.   Psychiatric/Behavioral: Negative.     Allergies  Patient has no known allergies.  Home Medications    BP (!) 148/83   Pulse 90   Temp 98.2 F (36.8 C) (Oral)   Resp 18   LMP 06/29/2016 (Approximate)   Physical Exam  Constitutional: She is oriented to person, place, and time. She appears well-developed and well-nourished.  HENT:  Head: Normocephalic.  Eyes: Pupils are equal, round, and reactive to light.  Neck: Normal range of motion.  Cardiovascular: Normal rate, regular rhythm, normal heart sounds and intact distal pulses.   Pulmonary/Chest: Effort normal  and breath sounds normal.  Abdominal: Soft. Bowel sounds are normal.  Musculoskeletal: Normal range of motion.  Neurological: She is alert and oriented to person, place, and time. She has normal reflexes.  Skin: Skin is warm and dry.  Psychiatric: She has a normal mood and affect. Her behavior is normal. Judgment and thought content normal.    MAU Course  Procedures (including critical care time)  Labs Reviewed  CBC WITH DIFFERENTIAL/PLATELET  COMPREHENSIVE METABOLIC PANEL  PROTEIN / CREATININE RATIO, URINE   No results found.   1. Gestational hypertension affecting third pregnancy   2. Pregnancy-induced hypertension in second trimester       MDM  Vss, headache better since fioricet. Labs stable. POC discussed with Dr. Royston Sinner pt to be d/c home to follow up in office next week for eval.

## 2017-05-26 NOTE — MAU Note (Signed)
Pt reports she had not felt well all day. B/p was ok before she went to work. Got to work and started having headache and dizzy seeing spots. Took b/p and it 153/104. Told to come in a get checked out.

## 2017-05-26 NOTE — Discharge Instructions (Signed)
Hypertension During Pregnancy Hypertension is also called high blood pressure. High blood pressure means that the force of your blood moving in your body is too strong. When you are pregnant, this condition should be watched carefully. It can cause problems for you and your baby. Follow these instructions at home: Eating and drinking  Drink enough fluid to keep your pee (urine) clear or pale yellow.  Eat healthy foods that are low in salt (sodium). ? Do not add salt to your food. ? Check labels on foods and drinks to see much salt is in them. Look on the label where you see "Sodium." Lifestyle  Do not use any products that contain nicotine or tobacco, such as cigarettes and e-cigarettes. If you need help quitting, ask your doctor.  Do not use alcohol.  Avoid caffeine.  Avoid stress. Rest and get plenty of sleep. General instructions  Take over-the-counter and prescription medicines only as told by your doctor.  While lying down, lie on your left side. This keeps pressure off your baby.  While sitting or lying down, raise (elevate) your feet. Try putting some pillows under your lower legs.  Exercise regularly. Ask your doctor what kinds of exercise are best for you.  Keep all prenatal and follow-up visits as told by your doctor. This is important. Contact a doctor if:  You have symptoms that your doctor told you to watch for, such as: ? Fever. ? Throwing up (vomiting). ? Headache. Get help right away if:  You have very bad pain in your belly (abdomen).  You are throwing up, and this does not get better with treatment.  You suddenly get swelling in your hands, ankles, or face.  You gain 4 lb (1.8 kg) or more in 1 week.  You get bleeding from your vagina.  You have blood in your pee.  You do not feel your baby moving as much as normal.  You have a change in vision.  You have muscle twitching or sudden tightening (spasms).  You have trouble breathing.  Your  lips or fingernails turn blue. This information is not intended to replace advice given to you by your health care provider. Make sure you discuss any questions you have with your health care provider. Document Released: 09/30/2010 Document Revised: 05/09/2016 Document Reviewed: 05/09/2016 Elsevier Interactive Patient Education  2017 [place]. Hypertension During Pregnancy Hypertension is also called high blood pressure. High blood pressure means that the force of your blood moving in your body is too strong. When you are pregnant, this condition should be watched carefully. It can cause problems for you and your baby. Follow these instructions at home: Eating and drinking  Drink enough fluid to keep your pee (urine) clear or pale yellow.  Eat healthy foods that are low in salt (sodium). ? Do not add salt to your food. ? Check labels on foods and drinks to see much salt is in them. Look on the label where you see "Sodium." Lifestyle  Do not use any products that contain nicotine or tobacco, such as cigarettes and e-cigarettes. If you need help quitting, ask your doctor.  Do not use alcohol.  Avoid caffeine.  Avoid stress. Rest and get plenty of sleep. General instructions  Take over-the-counter and prescription medicines only as told by your doctor.  While lying down, lie on your left side. This keeps pressure off your baby.  While sitting or lying down, raise (elevate) your feet. Try putting some pillows under your lower legs.  Exercise regularly. Ask your doctor what kinds of exercise are best for you.  Keep all prenatal and follow-up visits as told by your doctor. This is important. Contact a doctor if:  You have symptoms that your doctor told you to watch for, such as: ? Fever. ? Throwing up (vomiting). ? Headache. Get help right away if:  You have very bad pain in your belly (abdomen).  You are throwing up, and this does not get better with treatment.  You  suddenly get swelling in your hands, ankles, or face.  You gain 4 lb (1.8 kg) or more in 1 week.  You get bleeding from your vagina.  You have blood in your pee.  You do not feel your baby moving as much as normal.  You have a change in vision.  You have muscle twitching or sudden tightening (spasms).  You have trouble breathing.  Your lips or fingernails turn blue. This information is not intended to replace advice given to you by your health care provider. Make sure you discuss any questions you have with your health care provider. Document Released: 09/30/2010 Document Revised: 05/09/2016 Document Reviewed: 05/09/2016 Elsevier Interactive Patient Education  2017 [employer].

## 2017-06-01 DIAGNOSIS — Z3480 Encounter for supervision of other normal pregnancy, unspecified trimester: Secondary | ICD-10-CM | POA: Diagnosis not present

## 2017-06-06 ENCOUNTER — Ambulatory Visit (INDEPENDENT_AMBULATORY_CARE_PROVIDER_SITE_OTHER): Payer: 59 | Admitting: Cardiology

## 2017-06-06 ENCOUNTER — Encounter: Payer: Self-pay | Admitting: Cardiology

## 2017-06-06 VITALS — BP 130/74 | HR 90 | Ht 63.0 in | Wt 191.1 lb

## 2017-06-06 DIAGNOSIS — R011 Cardiac murmur, unspecified: Secondary | ICD-10-CM | POA: Insufficient documentation

## 2017-06-06 DIAGNOSIS — O139 Gestational [pregnancy-induced] hypertension without significant proteinuria, unspecified trimester: Secondary | ICD-10-CM | POA: Diagnosis not present

## 2017-06-06 NOTE — Progress Notes (Signed)
Cardiology Office Note:    Date:  06/06/2017   ID:  Diana Fischer, DOB [birth year], MRN [id number]  PCP:  Emeterio Reeve, DO  Cardiologist:  Jenean Lindau, MD   Referring MD: Emeterio Reeve, DO    ASSESSMENT:    1. Pregnancy induced hypertension, antepartum   2. Cardiac murmur    PLAN:    In order of problems listed above:  1. I discussed my findings with the patient at extensive length and reassured her. Her blood pressure as of today's fine. She tells me that she is now off work and this is helping her significantly.she is happy about this. Her stress level is much better and she is feeling much better and she tells me that her blood pressure has improved significantly. I mentioned to her about posture such as lying in the reclining on her left side whenever she feels tired or had such symptoms that she might be feeling a little dizzy and she agrees to do. Echocardiogram will be done to assess murmur heard on auscultation. Patient will be seen in follow-up appointment in 6 months or earlier if she has any concerns.   Medication Adjustments/Labs and Tests Ordered: Current medicines are reviewed at length with the patient today.  Concerns regarding medicines are outlined above.  No orders of the defined types were placed in this encounter.  No orders of the defined types were placed in this encounter.    History of Present Illness:    Diana Fischer is a 31 y.o. female who is being seen today for the evaluation of gestational hypertension at the request of Emeterio Reeve, DO. Patient is a pleasant 31 year old female. She is seen accompanied by her husband. She denies any chest pain orthopnea or PND. 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 During her activities and days when she is busy she occasionally feels dizzy and lightheaded especially when she is changing posture. She has history of gestational hypertension and is  being treated for this. Her medication has now been changed from labetalol to nifedipine. She tells me with nifedipine it's much better.at the time of my evaluation she is alert awake oriented and in no distress  Past Medical History:  Diagnosis Date  . Gestational hypertension   . Gestational hypertension 05/08/2017    Past Surgical History:  Procedure Laterality Date  . NO PAST SURGERIES    . WISDOM TOOTH EXTRACTION      Current Medications: Current Meds  Medication Sig  . acetaminophen (TYLENOL) 325 MG tablet Take 650 mg by mouth every 6 (six) hours as needed for headache.   . calcium carbonate (TUMS - DOSED IN MG ELEMENTAL CALCIUM) 500 MG chewable tablet Chew 1 tablet by mouth 4 (four) times daily as needed for indigestion or heartburn.  Marland Kitchen NIFEdipine (PROCARDIA-XL/ADALAT-CC/NIFEDICAL-XL) 30 MG 24 hr tablet Take 30 mg by mouth daily.  . ondansetron (ZOFRAN ODT) 4 MG disintegrating tablet Take 1 tablet (4 mg total) by mouth every 8 (eight) hours as needed for nausea or vomiting.  . Prenatal Vit-Fe Fumarate-FA (PRENATAL MULTIVITAMIN) TABS tablet Take 1 tablet by mouth at bedtime.  . ranitidine (ZANTAC) 150 MG tablet Take 150 mg by mouth at bedtime.     Allergies:   Patient has no known allergies.   Social History   Social History  . Marital status: Married    Spouse name: N/A  . Number of children: N/A  . Years of education: N/A  Social History Main Topics  . Smoking status: Never Smoker  . Smokeless tobacco: Never Used  . Alcohol use No  . Drug use: No  . Sexual activity: Yes    Birth control/ protection: None   Other Topics Concern  . None   Social History Narrative  . None     Family History: The patient's family history includes Cancer in her paternal grandfather; Heart attack in her maternal grandfather; Hyperlipidemia in her father and mother; Hypertension in her father and mother.  ROS:   Please see the history of present illness.    All other systems  reviewed and are negative.  EKGs/Labs/Other Studies Reviewed:    The following studies were reviewed today: I reviewed records from the referring office and discuss them with the patient at length.   Recent Labs: 05/26/2017: ALT 14; BUN 8; Creatinine, Ser 0.60; Hemoglobin 11.2; Platelets 273; Potassium 3.6; Sodium 135  Recent Lipid Panel No results found for: CHOL, TRIG, HDL, CHOLHDL, VLDL, LDLCALC, LDLDIRECT  Physical Exam:    VS:  BP 130/74   Pulse 90   Ht 5\' 3"  (1.6 m)   Wt 191 lb 1.9 oz (86.7 kg)   LMP 06/29/2016 (Approximate)   SpO2 98%   BMI 33.86 kg/m     Wt Readings from Last 3 Encounters:  06/06/17 191 lb 1.9 oz (86.7 kg)  05/07/17 189 lb (85.7 kg)  04/20/17 184 lb 8 oz (83.7 kg)     GEN: Patient is in no acute distress HEENT: Normal NECK: No JVD; No carotid bruits LYMPHATICS: No lymphadenopathy CARDIAC: S1 S2 regular, 2/6 systolic murmur at the apex. RESPIRATORY:  Clear to auscultation without rales, wheezing or rhonchi  ABDOMEN: patient is pregnant and therefore abdominal examination was not done. MUSCULOSKELETAL:  No edema; No deformity  SKIN: Warm and dry NEUROLOGIC:  Alert and oriented x 3 PSYCHIATRIC:  Normal affect    Signed, Jenean Lindau, MD  06/06/2017 12:05 PM    [place]

## 2017-06-06 NOTE — Patient Instructions (Signed)
Medication Instructions:  None  Labwork: None  Testing/Procedures: Your physician has requested that you have an echocardiogram. Echocardiography is a painless test that uses sound waves to create images of your heart. It provides your doctor with information about the size and shape of your heart and how well your heart's chambers and valves are working. This procedure takes approximately one hour. There are no restrictions for this procedure.    Follow-Up: 6 months  Any Other Special Instructions Will Be Listed Below (If Applicable).     If you need a refill on your cardiac medications before your next appointment, please call your pharmacy.

## 2017-06-08 DIAGNOSIS — Z3400 Encounter for supervision of normal first pregnancy, unspecified trimester: Secondary | ICD-10-CM | POA: Diagnosis not present

## 2017-06-08 DIAGNOSIS — Z3480 Encounter for supervision of other normal pregnancy, unspecified trimester: Secondary | ICD-10-CM | POA: Diagnosis not present

## 2017-06-08 DIAGNOSIS — Z23 Encounter for immunization: Secondary | ICD-10-CM | POA: Diagnosis not present

## 2017-06-12 ENCOUNTER — Ambulatory Visit (HOSPITAL_BASED_OUTPATIENT_CLINIC_OR_DEPARTMENT_OTHER): Payer: 59

## 2017-07-19 DIAGNOSIS — O10013 Pre-existing essential hypertension complicating pregnancy, third trimester: Secondary | ICD-10-CM | POA: Diagnosis not present

## 2017-07-19 DIAGNOSIS — Z3400 Encounter for supervision of normal first pregnancy, unspecified trimester: Secondary | ICD-10-CM | POA: Diagnosis not present

## 2017-07-19 DIAGNOSIS — Z3A32 32 weeks gestation of pregnancy: Secondary | ICD-10-CM | POA: Diagnosis not present

## 2017-07-25 DIAGNOSIS — O9989 Other specified diseases and conditions complicating pregnancy, childbirth and the puerperium: Secondary | ICD-10-CM | POA: Diagnosis not present

## 2017-07-25 DIAGNOSIS — Z3400 Encounter for supervision of normal first pregnancy, unspecified trimester: Secondary | ICD-10-CM | POA: Diagnosis not present

## 2017-07-25 DIAGNOSIS — Z3A33 33 weeks gestation of pregnancy: Secondary | ICD-10-CM | POA: Diagnosis not present

## 2017-08-01 DIAGNOSIS — Z3A34 34 weeks gestation of pregnancy: Secondary | ICD-10-CM | POA: Diagnosis not present

## 2017-08-01 DIAGNOSIS — Z3400 Encounter for supervision of normal first pregnancy, unspecified trimester: Secondary | ICD-10-CM | POA: Diagnosis not present

## 2017-08-01 DIAGNOSIS — O10013 Pre-existing essential hypertension complicating pregnancy, third trimester: Secondary | ICD-10-CM | POA: Diagnosis not present

## 2017-08-06 DIAGNOSIS — O10013 Pre-existing essential hypertension complicating pregnancy, third trimester: Secondary | ICD-10-CM | POA: Diagnosis not present

## 2017-08-06 DIAGNOSIS — Z3400 Encounter for supervision of normal first pregnancy, unspecified trimester: Secondary | ICD-10-CM | POA: Diagnosis not present

## 2017-08-06 DIAGNOSIS — Z3480 Encounter for supervision of other normal pregnancy, unspecified trimester: Secondary | ICD-10-CM | POA: Diagnosis not present

## 2017-08-06 DIAGNOSIS — Z3A35 35 weeks gestation of pregnancy: Secondary | ICD-10-CM | POA: Diagnosis not present

## 2017-08-09 ENCOUNTER — Encounter (HOSPITAL_COMMUNITY): Payer: Self-pay | Admitting: *Deleted

## 2017-08-09 ENCOUNTER — Telehealth (HOSPITAL_COMMUNITY): Payer: Self-pay | Admitting: *Deleted

## 2017-08-09 NOTE — Telephone Encounter (Signed)
Preadmission screen  

## 2017-08-10 DIAGNOSIS — Z3A36 36 weeks gestation of pregnancy: Secondary | ICD-10-CM | POA: Diagnosis not present

## 2017-08-10 DIAGNOSIS — O10013 Pre-existing essential hypertension complicating pregnancy, third trimester: Secondary | ICD-10-CM | POA: Diagnosis not present

## 2017-08-10 DIAGNOSIS — Z3400 Encounter for supervision of normal first pregnancy, unspecified trimester: Secondary | ICD-10-CM | POA: Diagnosis not present

## 2017-08-14 DIAGNOSIS — Z3A36 36 weeks gestation of pregnancy: Secondary | ICD-10-CM | POA: Diagnosis not present

## 2017-08-14 DIAGNOSIS — O10013 Pre-existing essential hypertension complicating pregnancy, third trimester: Secondary | ICD-10-CM | POA: Diagnosis not present

## 2017-08-14 LAB — OB RESULTS CONSOLE GBS
GBS: NEGATIVE
GBS: POSITIVE

## 2017-08-15 LAB — OB RESULTS CONSOLE GBS: STREP GROUP B AG: NEGATIVE

## 2017-08-16 NOTE — H&P (Signed)
Diana Fischer is a 31 y.o. female presenting for IOL. Pregnancy complicated by gestational hypertension on nifedipine 30mg  XL qd. U/S in office on 08/14/17 showed EFW 6# 7oz (64%), vtx, AFI 62%, BPP 8/8. OB History    Gravida Para Term Preterm AB Living   3 2 2     2    SAB TAB Ectopic Multiple Live Births           2     Past Medical History:  Diagnosis Date  . Dizzy spells   . Gestational hypertension   . Gestational hypertension 05/08/2017  . History of gestational hypertension    Past Surgical History:  Procedure Laterality Date  . NO PAST SURGERIES    . WISDOM TOOTH EXTRACTION     Family History: family history includes Cancer in her paternal grandfather; Heart attack in her maternal grandfather; Heart disease in her maternal grandfather; Hyperlipidemia in her father and mother; Hypertension in her father and mother; Uterine cancer in her maternal grandmother. Social History:  reports that  has never smoked. she has never used smokeless tobacco. She reports that she does not drink alcohol or use drugs.     Maternal Diabetes: No Genetic Screening: Normal Maternal Ultrasounds/Referrals: Normal Fetal Ultrasounds or other Referrals:  None Maternal Substance Abuse:  No Significant Maternal Medications:  Meds include: Other: nifedipine Significant Maternal Lab Results:  None Other Comments:  None  ROS Maternal Medical History:  Fetal activity: Perceived fetal activity is normal.        Last menstrual period 06/29/2016, currently breastfeeding. Maternal Exam:  Abdomen: Fetal presentation: vertex     Physical Exam  Cardiovascular: Normal rate and regular rhythm.  Respiratory: Effort normal and breath sounds normal.  GI: Soft. There is no tenderness.  Neurological: She has normal reflexes.    Prenatal labs: ABO, Rh: O/Positive/-- (05/18 0000) Antibody: Negative (05/18 0000) Rubella: Immune (05/18 0000) RPR: Nonreactive (05/18 0000)  HBsAg: Negative (05/18  0000)  HIV: Non-reactive (05/18 0000)  GBS:     Assessment/Plan: 31 yo G3P2 with gestational hypertension for 2 stage induction of labor   Shon Millet II 08/16/2017, 2:27 PM

## 2017-08-17 ENCOUNTER — Encounter (HOSPITAL_COMMUNITY): Payer: Self-pay

## 2017-08-17 ENCOUNTER — Inpatient Hospital Stay (HOSPITAL_COMMUNITY)
Admission: RE | Admit: 2017-08-17 | Discharge: 2017-08-18 | DRG: 807 | Disposition: A | Discharge status: Home / Self Care | Payer: 59 | Source: Ambulatory Visit | Attending: Obstetrics and Gynecology | Admitting: Obstetrics and Gynecology

## 2017-08-17 ENCOUNTER — Inpatient Hospital Stay (HOSPITAL_COMMUNITY): Payer: 59 | Admitting: Anesthesiology

## 2017-08-17 DIAGNOSIS — Z3A39 39 weeks gestation of pregnancy: Secondary | ICD-10-CM | POA: Diagnosis not present

## 2017-08-17 DIAGNOSIS — O139 Gestational [pregnancy-induced] hypertension without significant proteinuria, unspecified trimester: Secondary | ICD-10-CM

## 2017-08-17 DIAGNOSIS — O134 Gestational [pregnancy-induced] hypertension without significant proteinuria, complicating childbirth: Principal | ICD-10-CM | POA: Diagnosis present

## 2017-08-17 DIAGNOSIS — Z3A37 37 weeks gestation of pregnancy: Secondary | ICD-10-CM

## 2017-08-17 LAB — COMPREHENSIVE METABOLIC PANEL
ALBUMIN: 2.6 g/dL — AB (ref 3.5–5.0)
ALK PHOS: 189 U/L — AB (ref 38–126)
ALT: 13 U/L — AB (ref 14–54)
AST: 21 U/L (ref 15–41)
Anion gap: 10 (ref 5–15)
BILIRUBIN TOTAL: 0.2 mg/dL — AB (ref 0.3–1.2)
BUN: 7 mg/dL (ref 6–20)
CO2: 20 mmol/L — ABNORMAL LOW (ref 22–32)
CREATININE: 0.46 mg/dL (ref 0.44–1.00)
Calcium: 8.5 mg/dL — ABNORMAL LOW (ref 8.9–10.3)
Chloride: 103 mmol/L (ref 101–111)
GFR calc Af Amer: >60 mL/min (ref >60)
GLUCOSE: 84 mg/dL (ref 65–99)
Potassium: 3.4 mmol/L — ABNORMAL LOW (ref 3.5–5.1)
Sodium: 133 mmol/L — ABNORMAL LOW (ref 135–145)
TOTAL PROTEIN: 5.9 g/dL — AB (ref 6.5–8.1)

## 2017-08-17 LAB — CBC
HEMATOCRIT: 31.3 % — AB (ref 36.0–46.0)
HEMATOCRIT: 32.4 % — AB (ref 36.0–46.0)
HEMOGLOBIN: 10.9 g/dL — AB (ref 12.0–15.0)
Hemoglobin: 10.4 g/dL — ABNORMAL LOW (ref 12.0–15.0)
MCH: 31.1 pg (ref 26.0–34.0)
MCH: 31.9 pg (ref 26.0–34.0)
MCHC: 33.2 g/dL (ref 30.0–36.0)
MCHC: 33.6 g/dL (ref 30.0–36.0)
MCV: 93.7 fL (ref 78.0–100.0)
MCV: 94.7 fL (ref 78.0–100.0)
PLATELETS: 267 10*3/uL (ref 150–400)
Platelets: 258 10*3/uL (ref 150–400)
RBC: 3.34 MIL/uL — ABNORMAL LOW (ref 3.87–5.11)
RBC: 3.42 MIL/uL — ABNORMAL LOW (ref 3.87–5.11)
RDW: 13.2 % (ref 11.5–15.5)
RDW: 13.2 % (ref 11.5–15.5)
WBC: 12.2 10*3/uL — AB (ref 4.0–10.5)
WBC: 12.6 10*3/uL — ABNORMAL HIGH (ref 4.0–10.5)

## 2017-08-17 LAB — TYPE AND SCREEN
ABO/RH(D): O POS
Antibody Screen: NEGATIVE

## 2017-08-17 LAB — RPR: RPR Ser Ql: NONREACTIVE

## 2017-08-17 LAB — ABO/RH: ABO/RH(D): O POS

## 2017-08-17 MED ORDER — PHENYLEPHRINE 40 MCG/ML (10ML) SYRINGE FOR IV PUSH (FOR BLOOD PRESSURE SUPPORT)
80.0000 ug | PREFILLED_SYRINGE | INTRAVENOUS | Status: AC | PRN
  Administered 2017-08-17 (×3): 80 ug via INTRAVENOUS

## 2017-08-17 MED ORDER — ONDANSETRON HCL 4 MG/2ML IJ SOLN: 4.0000 mg | INTRAMUSCULAR | Status: DC | PRN

## 2017-08-17 MED ORDER — ZOLPIDEM TARTRATE 5 MG PO TABS: 5.0000 mg | ORAL_TABLET | Freq: Every evening | ORAL | Status: DC | PRN

## 2017-08-17 MED ORDER — EPHEDRINE 5 MG/ML INJ
10.0000 mg | INTRAVENOUS | Status: DC | PRN
Start: 2017-08-17 — End: 2017-08-17
  Filled 2017-08-17: qty 2

## 2017-08-17 MED ORDER — LIDOCAINE HCL (PF) 1 % IJ SOLN
INTRAMUSCULAR | Status: DC | PRN
  Administered 2017-08-17: 4 mL
  Administered 2017-08-17: 6 mL via EPIDURAL

## 2017-08-17 MED ORDER — DIPHENHYDRAMINE HCL 50 MG/ML IJ SOLN: 12.5000 mg | INTRAMUSCULAR | Status: DC | PRN

## 2017-08-17 MED ORDER — OXYCODONE-ACETAMINOPHEN 5-325 MG PO TABS: 2.0000 | ORAL_TABLET | ORAL | Status: DC | PRN

## 2017-08-17 MED ORDER — LIDOCAINE HCL (PF) 1 % IJ SOLN
30.0000 mL | INTRAMUSCULAR | Status: DC | PRN
  Filled 2017-08-17: qty 30

## 2017-08-17 MED ORDER — SENNOSIDES-DOCUSATE SODIUM 8.6-50 MG PO TABS
2.0000 | ORAL_TABLET | ORAL | Status: DC
  Administered 2017-08-17: 2 via ORAL
  Filled 2017-08-17: qty 2

## 2017-08-17 MED ORDER — BUPIVACAINE HCL (PF) 0.25 % IJ SOLN
INTRAMUSCULAR | Status: DC | PRN
  Administered 2017-08-17: 3 mL via EPIDURAL

## 2017-08-17 MED ORDER — COCONUT OIL OIL: 1.0000 "application " | TOPICAL_OIL | Status: DC | PRN

## 2017-08-17 MED ORDER — OXYTOCIN 40 UNITS IN LACTATED RINGERS INFUSION - SIMPLE MED
2.5000 [IU]/h | INTRAVENOUS | Status: DC
  Filled 2017-08-17 (×2): qty 1000

## 2017-08-17 MED ORDER — ACETAMINOPHEN 325 MG PO TABS
650.0000 mg | ORAL_TABLET | ORAL | Status: DC | PRN
Start: 2017-08-17 — End: 2017-08-17
  Administered 2017-08-17: 650 mg via ORAL
  Filled 2017-08-17: qty 2

## 2017-08-17 MED ORDER — NIFEDIPINE ER OSMOTIC RELEASE 30 MG PO TB24
30.0000 mg | ORAL_TABLET | Freq: Every day | ORAL | Status: DC
  Administered 2017-08-17: 30 mg via ORAL
  Filled 2017-08-17 (×2): qty 1

## 2017-08-17 MED ORDER — SOD CITRATE-CITRIC ACID 500-334 MG/5ML PO SOLN: 30.0000 mL | ORAL | Status: DC | PRN

## 2017-08-17 MED ORDER — PHENYLEPHRINE 40 MCG/ML (10ML) SYRINGE FOR IV PUSH (FOR BLOOD PRESSURE SUPPORT)
80.0000 ug | PREFILLED_SYRINGE | INTRAVENOUS | Status: DC | PRN
  Filled 2017-08-17: qty 5

## 2017-08-17 MED ORDER — NIFEDIPINE ER OSMOTIC RELEASE 30 MG PO TB24
30.0000 mg | ORAL_TABLET | Freq: Every day | ORAL | Status: DC
  Administered 2017-08-18: 30 mg via ORAL
  Filled 2017-08-17 (×2): qty 1

## 2017-08-17 MED ORDER — WITCH HAZEL-GLYCERIN EX PADS
1.0000 "application " | MEDICATED_PAD | CUTANEOUS | Status: DC | PRN
  Administered 2017-08-17: 1 via TOPICAL

## 2017-08-17 MED ORDER — EPHEDRINE 5 MG/ML INJ
10.0000 mg | INTRAVENOUS | Status: DC | PRN
  Filled 2017-08-17: qty 2

## 2017-08-17 MED ORDER — DIPHENHYDRAMINE HCL 25 MG PO CAPS: 25.0000 mg | ORAL_CAPSULE | Freq: Four times a day (QID) | ORAL | Status: DC | PRN

## 2017-08-17 MED ORDER — ONDANSETRON HCL 4 MG/2ML IJ SOLN
4.0000 mg | Freq: Four times a day (QID) | INTRAMUSCULAR | Status: DC | PRN
  Administered 2017-08-17: 4 mg via INTRAVENOUS
  Filled 2017-08-17: qty 2

## 2017-08-17 MED ORDER — LACTATED RINGERS IV SOLN
INTRAVENOUS | Status: DC
  Administered 2017-08-17 (×3): via INTRAVENOUS

## 2017-08-17 MED ORDER — OXYCODONE-ACETAMINOPHEN 5-325 MG PO TABS
1.0000 | ORAL_TABLET | ORAL | Status: DC | PRN
Start: 2017-08-17 — End: 2017-08-17

## 2017-08-17 MED ORDER — OXYCODONE HCL 5 MG PO TABS
10.0000 mg | ORAL_TABLET | ORAL | Status: DC | PRN
  Administered 2017-08-18 (×2): 10 mg via ORAL
  Filled 2017-08-17 (×2): qty 2

## 2017-08-17 MED ORDER — FENTANYL 2.5 MCG/ML BUPIVACAINE 1/10 % EPIDURAL INFUSION (WH - ANES)
INTRAMUSCULAR | Status: AC
Start: 2017-08-17 — End: 2017-08-17
  Filled 2017-08-17: qty 100

## 2017-08-17 MED ORDER — BENZOCAINE-MENTHOL 20-0.5 % EX AERO
1.0000 "application " | INHALATION_SPRAY | CUTANEOUS | Status: DC | PRN
  Administered 2017-08-17 – 2017-08-18 (×2): 1 via TOPICAL
  Filled 2017-08-17 (×2): qty 56

## 2017-08-17 MED ORDER — IBUPROFEN 600 MG PO TABS
600.0000 mg | ORAL_TABLET | Freq: Four times a day (QID) | ORAL | Status: DC
  Administered 2017-08-17 – 2017-08-18 (×5): 600 mg via ORAL
  Filled 2017-08-17 (×5): qty 1

## 2017-08-17 MED ORDER — SIMETHICONE 80 MG PO CHEW: 80.0000 mg | CHEWABLE_TABLET | ORAL | Status: DC | PRN

## 2017-08-17 MED ORDER — TETANUS-DIPHTH-ACELL PERTUSSIS 5-2.5-18.5 LF-MCG/0.5 IM SUSP: 0.5000 mL | Freq: Once | INTRAMUSCULAR | Status: DC

## 2017-08-17 MED ORDER — ONDANSETRON HCL 4 MG PO TABS: 4.0000 mg | ORAL_TABLET | ORAL | Status: DC | PRN

## 2017-08-17 MED ORDER — PHENYLEPHRINE 40 MCG/ML (10ML) SYRINGE FOR IV PUSH (FOR BLOOD PRESSURE SUPPORT)
PREFILLED_SYRINGE | INTRAVENOUS | Status: AC
  Administered 2017-08-17: 80 ug via INTRAVENOUS
  Filled 2017-08-17: qty 20

## 2017-08-17 MED ORDER — DIBUCAINE 1 % RE OINT
1.0000 "application " | TOPICAL_OINTMENT | RECTAL | Status: DC | PRN
  Administered 2017-08-17: 1 via RECTAL
  Filled 2017-08-17: qty 28

## 2017-08-17 MED ORDER — SODIUM BICARBONATE 8.4 % IV SOLN
INTRAVENOUS | Status: DC | PRN
  Administered 2017-08-17: 3 mL via EPIDURAL

## 2017-08-17 MED ORDER — ACETAMINOPHEN 325 MG PO TABS
650.0000 mg | ORAL_TABLET | ORAL | Status: DC | PRN
  Administered 2017-08-18 (×3): 650 mg via ORAL
  Filled 2017-08-17 (×3): qty 2

## 2017-08-17 MED ORDER — FENTANYL 2.5 MCG/ML BUPIVACAINE 1/10 % EPIDURAL INFUSION (WH - ANES)
14.0000 mL/h | INTRAMUSCULAR | Status: DC | PRN
  Administered 2017-08-17: 14 mL/h via EPIDURAL

## 2017-08-17 MED ORDER — PRENATAL MULTIVITAMIN CH
1.0000 | ORAL_TABLET | Freq: Every day | ORAL | Status: DC
  Administered 2017-08-18: 1 via ORAL
  Filled 2017-08-17: qty 1

## 2017-08-17 MED ORDER — BUTORPHANOL TARTRATE 1 MG/ML IJ SOLN
1.0000 mg | INTRAMUSCULAR | Status: DC | PRN
  Administered 2017-08-17: 1 mg via INTRAVENOUS
  Filled 2017-08-17: qty 1

## 2017-08-17 MED ORDER — LACTATED RINGERS IV SOLN: 500.0000 mL | INTRAVENOUS | Status: DC | PRN

## 2017-08-17 MED ORDER — TERBUTALINE SULFATE 1 MG/ML IJ SOLN
0.2500 mg | Freq: Once | INTRAMUSCULAR | Status: DC | PRN
  Filled 2017-08-17: qty 1

## 2017-08-17 MED ORDER — OXYTOCIN BOLUS FROM INFUSION
500.0000 mL | Freq: Once | INTRAVENOUS | Status: AC
  Administered 2017-08-17: 500 mL via INTRAVENOUS

## 2017-08-17 MED ORDER — OXYCODONE HCL 5 MG PO TABS
5.0000 mg | ORAL_TABLET | ORAL | Status: DC | PRN
  Administered 2017-08-18: 5 mg via ORAL
  Filled 2017-08-17: qty 1

## 2017-08-17 MED ORDER — FLEET ENEMA 7-19 GM/118ML RE ENEM: 1.0000 | ENEMA | RECTAL | Status: DC | PRN

## 2017-08-17 MED ORDER — LACTATED RINGERS IV SOLN: 500.0000 mL | Freq: Once | INTRAVENOUS | Status: DC

## 2017-08-17 MED ORDER — MISOPROSTOL 25 MCG QUARTER TABLET
25.0000 ug | ORAL_TABLET | ORAL | Status: DC | PRN
  Administered 2017-08-17 (×3): 25 ug via VAGINAL
  Filled 2017-08-17 (×4): qty 1

## 2017-08-17 NOTE — Lactation Note (Signed)
This note was copied from a baby's chart. Lactation Consultation Note  Patient Name: Diana Fischer OZDGU'Y Date: 08/17/2017 Reason for consult: Initial assessment;Early term 37-38.6wks;Mother's request  Initial consult at 42 hrs old; Mom is P3 with 3-7 weeks experience breastfeeding 2 previous babies.  Mom GHT on Labetalol and Procardia. [employer] Employee Ga 37.0; BW 7 lbs, 1.6 oz.  PPV at birth after vaginal vacuum assisted delivery with apgars of 5&8.  Vanishing Twin. RN called for consult; stated baby is feeding but is still hungry and mom concerned about milk supply.  Questioning whether she needs to supplement.   Infant has breastfed x2 (10-15 min) since birth; voids-1; stools-0; no LS recorded.   Upon entering room; infant in crib showing feeding cues.   LC reviewed hand expression with mom with return demonstration with colostrum easily expressed. Mom attempted latching infant using cradle hold, but unable to attain depth. LC assisted and taught cross-cradle hold with breast support right breast.  Infant latched with depth and sucked in rhythmical sucking motion.  LOTS of swallows heard.  LS-9.   Taught FOB how to assist at breast with teacup hold.   Gave LPTI guidelines, but explained to parents no need to initiate LPTI guidelines at this time since baby is feeding well with lots of swallows.  But wanted parents to be aware that early term babies can act like LPTI.   Gave curved-tip syringe, colostrum container, and spoons for EBM supplementation if needed.  Explained how to use.  Taught FOB how to spoon feed infant.   Educated on size of infant's stomach, cluster feeding, and continuing to feed with feeding cues.   Lactation brochure given and informed of hospital support group and OP services. Encouraged to call RN if needed for assistance.    Maternal Data Has patient been taught Hand Expression?: Yes(with return demonstration and observation of colostrum easily appearing) Does  the patient have breastfeeding experience prior to this delivery?: Yes  Feeding Feeding Type: Breast Fed  LATCH Score Latch: Grasps breast easily, tongue down, lips flanged, rhythmical sucking.  Audible Swallowing: Spontaneous and intermittent  Type of Nipple: Everted at rest and after stimulation  Comfort (Breast/Nipple): Soft / non-tender  Hold (Positioning): Assistance needed to correctly position infant at breast and maintain latch.  LATCH Score: 9  Interventions Interventions: Breast feeding basics reviewed;Assisted with latch;Skin to skin;Adjust position;Support pillows;Hand express;Position options;Expressed milk  Lactation Tools Discussed/Used WIC Program: No Pump Review: Setup, frequency, and cleaning Initiated by:: Gaynelle Adu, IBCLC   Consult Status Consult Status: Follow-up Date: 08/18/17 Follow-up type: In-patient    Santiago, Graf 08/17/2017, 11:12 PM

## 2017-08-17 NOTE — Anesthesia Preprocedure Evaluation (Signed)
Anesthesia Evaluation  Patient identified by MRN, date of birth, ID band Patient awake    Reviewed: Allergy & Precautions, H&P , Patient's Chart, lab work & pertinent test results  Airway Mallampati: II  TM Distance: >3 FB Neck ROM: full    Dental no notable dental hx.    Pulmonary    Pulmonary exam normal breath sounds clear to auscultation       Cardiovascular Exercise Tolerance: Good hypertension,  Rhythm:regular Rate:Normal     Neuro/Psych    GI/Hepatic   Endo/Other  Gestational  Renal/GU      Musculoskeletal   Abdominal   Peds  Hematology   Anesthesia Other Findings   Reproductive/Obstetrics                             Anesthesia Physical Anesthesia Plan  ASA: II  Anesthesia Plan: Epidural   Post-op Pain Management:    Induction:   PONV Risk Score and Plan:   Airway Management Planned:   Additional Equipment:   Intra-op Plan:   Post-operative Plan:   Informed Consent: I have reviewed the patients History and Physical, chart, labs and discussed the procedure including the risks, benefits and alternatives for the proposed anesthesia with the patient or authorized representative who has indicated his/her understanding and acceptance.   Dental Advisory Given  Plan Discussed with:   Anesthesia Plan Comments: (Labs checked- platelets confirmed with RN in room. Fetal heart tracing, per RN, reported to be stable enough for sitting procedure. Discussed epidural, and patient consents to the procedure:  included risk of possible headache,backache, failed block, allergic reaction, and nerve injury. This patient was asked if she had any questions or concerns before the procedure started.)        Anesthesia Quick Evaluation

## 2017-08-17 NOTE — Anesthesia Pain Management Evaluation Note (Signed)
  CRNA Pain Management Visit Note  Patient: Diana Fischer, 31 y.o., female  "Hello I am a member of the anesthesia team at [facility]. We have an anesthesia team available at all times to provide care throughout the hospital, including epidural management and anesthesia for C-section. I don't know your plan for the delivery whether it a natural birth, water birth, IV sedation, nitrous supplementation, doula or epidural, but we want to meet your pain goals."   1.Was your pain managed to your expectations on prior hospitalizations?   Yes   2.What is your expectation for pain management during this hospitalization?     Epidural  3.How can we help you reach that goal? epidural  Record the patient's initial score and the patient's pain goal.   Pain: 7  Pain Goal: 3 [facility] wants you to be able to say your pain was always managed very well.  [name] 08/17/2017

## 2017-08-17 NOTE — Progress Notes (Signed)
FHT cat one         Good response to scalp stim UCs q2-3 min Uterus soft between UCs Cx 4/70/-2     + blood - poss blood show AROM clear, single clot passes in front of AF from cervix             No continued bleeding IUPC placed Watch closely-D/W patient

## 2017-08-17 NOTE — Anesthesia Procedure Notes (Signed)
Epidural Patient location during procedure: OB  Staffing Anesthesiologist: Lyndle Herrlich, MD  Preanesthetic Checklist Completed: patient identified, pre-op evaluation, timeout performed, IV checked, risks and benefits discussed and monitors and equipment checked  Epidural Patient position: sitting Prep: DuraPrep Patient monitoring: blood pressure and continuous pulse ox Approach: right paramedian Location: L3-L4 Injection technique: LOR air  Needle:  Needle type: Tuohy  Needle gauge: 17 G Needle insertion depth: 6 cm Catheter type: closed end flexible Catheter size: 19 Gauge Catheter at skin depth: 12 cm Test dose: negative  Assessment Sensory level: T8  Additional Notes   Dosing of Epidural:  1st dose, through catheter ............................................Marland Kitchen  Xylocaine 40 mg  2nd dose, through catheter, after waiting 3 minutes........Marland KitchenXylocaine 60 mg    As each dose occurred, patient was free of IV sx; and patient exhibited no evidence of SA injection.  Patient is more comfortable after epidural dosed. Please see RN's note for documentation of vital signs,and FHR which are stable.  Patient reminded not to try to ambulate with numb legs, and that an RN must be present when she attempts to get up.

## 2017-08-17 NOTE — Progress Notes (Signed)
Operative Delivery Note At 3:54 PM a viable female was delivered via Vaginal, Vacuum Neurosurgeon).  Presentation: vertex; Position: Left,, Occiput,, Anterior; Station: +4.  Verbal consent: obtained from patient.  Risks and benefits discussed in detail.  Risks include, but are not limited to the risks of anesthesia, bleeding, infection, damage to maternal tissues, fetal cephalhematoma.  There is also the risk of inability to effect vaginal delivery of the head, or shoulder dystocia that cannot be resolved by established maneuvers, leading to the need for emergency cesarean section. Pushing with good progress. Variable decels after pushes getting progressively deeper.  VE applied, no popoffs, over 2 ucs with good progress. Port wine fluid noted after baby.  APGAR: 5, ; weight  .   Placenta status:intact , possible small clot at margin of placenta.  Placenta to path. Cord: 3 vessels with the following complications: .  Cord pH: pending  Anesthesia:  epidural Instruments: mushroom VE Episiotomy: None Lacerations: 1st degree Suture Repair: 2.0 vicryl rapide Est. Blood Loss (mL): 400 Initial newborn HR <100. Baby taken to warming table for stim, bagged with O2 and pediatrics team called.  Mom to postpartum.  Baby to Couplet care / Skin to Skin.  Shon Millet II 08/17/2017, 4:13 PM

## 2017-08-17 NOTE — Progress Notes (Signed)
Patient reports GBS negative during admission.   Juluis Mire, RN

## 2017-08-17 NOTE — Progress Notes (Signed)
No HA/blurry vision No ROM  Vitals:   08/17/17 0601 08/17/17 0700  BP: 129/75 126/65  Pulse: 94 97  Resp: 16 16  Temp: 98.3 F (36.8 C)    Lungs CTA Cor RRR Abd no epigastric tenderness DTR 2+  FHT cat one UCs q2-4 min  Feeling most of them  Results for orders placed or performed during the hospital encounter of 08/17/17 (from the past 24 hour(s))  CBC     Status: Abnormal   Collection Time: 08/17/17 12:52 AM  Result Value Ref Range   WBC 12.2 (H) 4.0 - 10.5 K/uL   RBC 3.34 (L) 3.87 - 5.11 MIL/uL   Hemoglobin 10.4 (L) 12.0 - 15.0 g/dL   HCT 31.3 (L) 36.0 - 46.0 %   MCV 93.7 78.0 - 100.0 fL   MCH 31.1 26.0 - 34.0 pg   MCHC 33.2 30.0 - 36.0 g/dL   RDW 13.2 11.5 - 15.5 %   Platelets 267 150 - 400 K/uL  Comprehensive metabolic panel     Status: Abnormal   Collection Time: 08/17/17 12:52 AM  Result Value Ref Range   Sodium 133 (L) 135 - 145 mmol/L   Potassium 3.4 (L) 3.5 - 5.1 mmol/L   Chloride 103 101 - 111 mmol/L   CO2 20 (L) 22 - 32 mmol/L   Glucose, Bld 84 65 - 99 mg/dL   BUN 7 6 - 20 mg/dL   Creatinine, Ser 0.46 0.44 - 1.00 mg/dL   Calcium 8.5 (L) 8.9 - 10.3 mg/dL   Total Protein 5.9 (L) 6.5 - 8.1 g/dL   Albumin 2.6 (L) 3.5 - 5.0 g/dL   AST 21 15 - 41 U/L   ALT 13 (L) 14 - 54 U/L   Alkaline Phosphatase 189 (H) 38 - 126 U/L   Total Bilirubin 0.2 (L) 0.3 - 1.2 mg/dL   GFR calc non Af Amer >60 >60 mL/min   GFR calc Af Amer >60 >60 mL/min   Anion gap 10 5 - 15  Type and screen [place]     Status: None (Preliminary result)   Collection Time: 08/17/17  1:04 AM  Result Value Ref Range   ABO/RH(D) O POS    Antibody Screen PENDING    Sample Expiration 08/20/2017   ABO/Rh     Status: None   Collection Time: 08/17/17  1:04 AM  Result Value Ref Range   ABO/RH(D) O POS    A/P: Gestational HTN         Last cystotec about 5:45am         Check cervix @ 10 am         D/W patient

## 2017-08-18 LAB — CBC
HEMATOCRIT: 28.6 % — AB (ref 36.0–46.0)
HEMOGLOBIN: 9.6 g/dL — AB (ref 12.0–15.0)
MCH: 31.6 pg (ref 26.0–34.0)
MCHC: 33.6 g/dL (ref 30.0–36.0)
MCV: 94.1 fL (ref 78.0–100.0)
Platelets: 249 10*3/uL (ref 150–400)
RBC: 3.04 MIL/uL — AB (ref 3.87–5.11)
RDW: 13.2 % (ref 11.5–15.5)
WBC: 13.1 10*3/uL — AB (ref 4.0–10.5)

## 2017-08-18 MED ORDER — IBUPROFEN 600 MG PO TABS: 600.0000 mg | ORAL_TABLET | Freq: Four times a day (QID) | ORAL | 0 refills | Status: DC | PRN

## 2017-08-18 MED ORDER — HYDROCORTISONE ACE-PRAMOXINE 1-1 % RE FOAM
1.0000 | Freq: Two times a day (BID) | RECTAL | Status: DC | PRN
  Administered 2017-08-18: 1 via RECTAL
  Filled 2017-08-18: qty 10

## 2017-08-18 MED ORDER — NIFEDIPINE ER 30 MG PO TB24: 30.0000 mg | ORAL_TABLET | Freq: Every day | ORAL | 0 refills | Status: DC

## 2017-08-18 MED ORDER — ACETAMINOPHEN 325 MG PO TABS: 650.0000 mg | ORAL_TABLET | Freq: Four times a day (QID) | ORAL | 0 refills | Status: DC | PRN

## 2017-08-18 MED ORDER — HYDROCORTISONE ACE-PRAMOXINE 1-1 % RE FOAM: 1.0000 | Freq: Three times a day (TID) | RECTAL | 2 refills | Status: DC

## 2017-08-18 NOTE — Discharge Instructions (Signed)
Call office for blood pressure check in 5-7 days

## 2017-08-18 NOTE — Lactation Note (Signed)
This note was copied from a baby's chart. Lactation Consultation Note  Patient Name: Girl Chayil Gantt [id number] Date: 08/18/2017 Reason for consult: Follow-up assessment;Mother's request  2nd Golden visit, and as LC entered the room baby already latched  With depth/ modified laid back, per mom comfortable, multiple  Swallows, increased with breast compressions.  Mom aware to watch for non- nutritive feeding , and hanging out.  Mom and dad seemed excited baby is getting for consistent with  Latching.     Maternal Data    Feeding Feeding Type: Breast Fed Length of feed: (multiple swallows,increased )  LATCH Score Latch: (latched with depth )  Audible Swallowing: (multiple swallows )     Comfort (Breast/Nipple): (per mom comfortable )  Hold (Positioning): (mom indpendent  )     Interventions Interventions: Breast feeding basics reviewed;Skin to skin;Breast compression  Lactation Tools Discussed/Used     Consult Status Consult Status: Complete Date: 08/18/17 Follow-up type: In-patient    [place] 08/18/2017, 3:24 PM

## 2017-08-18 NOTE — Progress Notes (Signed)
Post Partum Day 1 Subjective: no complaints, up ad lib, voiding, tolerating PO and + flatus  Objective: Blood pressure (!) 142/93, pulse 78, temperature 97.6 F (36.4 C), temperature source Oral, resp. rate 18, height 5\' 4"  (1.626 m), weight 203 lb (92.1 kg), last menstrual period 06/29/2016, SpO2 99 %, unknown if currently breastfeeding.  Physical Exam:  General: alert, cooperative and no distress Lochia: appropriate Uterine Fundus: firm Incision: healing well DVT Evaluation: No evidence of DVT seen on physical exam.  Recent Labs    08/17/17 1351 08/18/17 0518  HGB 10.9* 9.6*  HCT 32.4* 28.6*    Assessment/Plan: Discharge home   LOS: 1 day   Diana Fischer 08/18/2017, 8:16 AM

## 2017-08-18 NOTE — Anesthesia Postprocedure Evaluation (Signed)
Anesthesia Post Note  Patient: Diana Fischer  Procedure(s) Performed: AN AD HOC LABOR EPIDURAL     Patient location during evaluation: Mother Baby Anesthesia Type: Epidural Level of consciousness: awake, awake and alert, oriented and patient cooperative Pain management: pain level controlled Vital Signs Assessment: post-procedure vital signs reviewed and stable Respiratory status: spontaneous breathing, nonlabored ventilation and respiratory function stable Cardiovascular status: blood pressure returned to baseline and stable Postop Assessment: no headache, no backache, epidural receding, adequate PO intake, no apparent nausea or vomiting and patient able to bend at knees Anesthetic complications: no    Last Vitals:  Vitals:   08/18/17 0005 08/18/17 0507  BP: 136/88 (!) 142/93  Pulse: 79 78  Resp:  18  Temp:  36.4 C  SpO2:      Last Pain:  Vitals:   08/18/17 0704  TempSrc:   PainSc: Asleep   Pain Goal:                 Stefani Dama

## 2017-08-18 NOTE — Lactation Note (Signed)
This note was copied from a baby's chart. Lactation Consultation Note  Patient Name: Diana Fischer [id number] Date: 08/18/2017 Reason for consult: Follow-up assessment;Early term 37-38.6wks;Infant weight loss(2% weight loss, mom heaDING TO THE SHOWER AFTER A short conversation/ LC enc to call when baby to feed )  Baby is 45 hours old,  LC reviewed and updated the doc flow sheets.  LC reviewed importance of STS feedings until the baby can stay awake for feedings.  Nutritive vs non -  Nutritive feeding patterns and to watch for hanging out latched.  Keeping feeding diary sheet , and how wet the diaper should be by 4-5 days.  Sore nipple and engorgement prevention and tx .  Mom already  has a hand pump and LC obtained moms DEBP benefits pump for [facility] health employee.  Proper paperwork completed.  Mother informed of post-discharge support and given phone number to the lactation department, including services for phone call assistance; out-patient appointments; and breastfeeding support group. List of other breastfeeding resources in the community given in the handout. Encouraged mother to call for problems or concerns related to breastfeeding.    Maternal Data    Feeding    LATCH Score                   Interventions Interventions: Breast feeding basics reviewed  Lactation Tools Discussed/Used     Consult Status Consult Status: Follow-up Date: 08/18/17 Follow-up type: In-patient    [place] 08/18/2017, 2:53 PM

## 2017-08-18 NOTE — Discharge Summary (Signed)
Obstetric Discharge Summary Reason for Admission: induction of labor Prenatal Procedures: none Intrapartum Procedures: vacuum Postpartum Procedures: none Complications-Operative and Postpartum: none Hemoglobin  Date Value Ref Range Status  08/18/2017 9.6 (L) 12.0 - 15.0 g/dL Final   HCT  Date Value Ref Range Status  08/18/2017 28.6 (L) 36.0 - 46.0 % Final    Physical Exam:  General: alert, cooperative and no distress Lochia: appropriate Uterine Fundus: firm Incision: healing well DVT Evaluation: No evidence of DVT seen on physical exam.  Discharge Diagnoses: Term Pregnancy-delivered  Discharge Information: Date: 08/18/2017 Activity: pelvic rest Diet: routine Medications: PNV, Ibuprofen and nidedipine Condition: stable Instructions: refer to practice specific booklet Discharge to: home   Newborn Data: Live born female  Birth Weight: 7 lb 1.6 oz (3221 g) APGAR: 5, 8  Newborn Delivery   Birth date/time:  08/17/2017 15:54:00 Delivery type:  Vaginal, Vacuum (Extractor)     Home with mother.  Shon Millet II 08/18/2017, 8:21 AM

## 2017-09-26 DIAGNOSIS — Z1389 Encounter for screening for other disorder: Secondary | ICD-10-CM | POA: Diagnosis not present

## 2017-10-02 ENCOUNTER — Telehealth: Payer: 59 | Admitting: Family

## 2017-10-02 DIAGNOSIS — J019 Acute sinusitis, unspecified: Secondary | ICD-10-CM

## 2017-10-02 DIAGNOSIS — B9689 Other specified bacterial agents as the cause of diseases classified elsewhere: Secondary | ICD-10-CM | POA: Diagnosis not present

## 2017-10-02 MED ORDER — AMOXICILLIN-POT CLAVULANATE 875-125 MG PO TABS: 1.0000 | ORAL_TABLET | Freq: Two times a day (BID) | ORAL | 0 refills | Status: DC

## 2017-10-02 NOTE — Progress Notes (Signed)

## 2017-11-30 ENCOUNTER — Ambulatory Visit: Payer: 59 | Admitting: Physician Assistant

## 2017-11-30 ENCOUNTER — Encounter: Payer: Self-pay | Admitting: Physician Assistant

## 2017-11-30 VITALS — BP 155/102 | HR 82 | Temp 98.5°F | Wt 180.0 lb

## 2017-11-30 DIAGNOSIS — O165 Unspecified maternal hypertension, complicating the puerperium: Secondary | ICD-10-CM | POA: Diagnosis not present

## 2017-11-30 DIAGNOSIS — J069 Acute upper respiratory infection, unspecified: Secondary | ICD-10-CM

## 2017-11-30 LAB — POCT RAPID STREP A (OFFICE): RAPID STREP A SCREEN: NEGATIVE

## 2017-11-30 MED ORDER — NIFEDIPINE ER 60 MG PO TB24: 60.0000 mg | ORAL_TABLET | Freq: Every day | ORAL | 5 refills | Status: DC

## 2017-11-30 MED ORDER — IPRATROPIUM BROMIDE 0.06 % NA SOLN: 2.0000 | Freq: Four times a day (QID) | NASAL | 0 refills | Status: DC | PRN

## 2017-11-30 NOTE — Patient Instructions (Addendum)
For nasal congestion: - Atrovent nasal spray up to four times daily - Mucinex-D   For sore throat: - Tylenol 1000mg  every 8 hours as needed for throat pain. Alternate with Ibuprofen 600mg  every 6 hours - Cepacol throat lozenges - Warm salt water gargles   For your blood pressure: - Goal <130/80 - increase Nifedipine to 60 mg daily - monitor and log blood pressures at home - check around the same time each day in a relaxed setting - Limit salt to <2000 mg/day - Follow DASH eating plan - limit alcohol to 2 standard drinks per day for men and 1 per day for women - avoid tobacco products - weight loss: 7% of current body weight - follow-up every 6 months for your blood pressure

## 2017-11-30 NOTE — Progress Notes (Signed)
HPI:                                                                Diana Fischer is a 32 y.o. female who presents to [place]: [place] today for cough and congestion  URI   This is a new problem. The current episode started in the past 7 days. The problem has been unchanged. There has been no fever. Associated symptoms include abdominal pain (resolved), congestion, diarrhea (last episode Tuesday), nausea, rhinorrhea, sinus pain, a sore throat (worse on left) and swollen glands (left-sided). Pertinent negatives include no wheezing. Joint pain: myalgia.    No flowsheet data found.  No flowsheet data found.    Past Medical History:  Diagnosis Date  . Dizzy spells   . Gestational hypertension   . Gestational hypertension 05/08/2017  . History of gestational hypertension    Past Surgical History:  Procedure Laterality Date  . NO PAST SURGERIES    . WISDOM TOOTH EXTRACTION     Social History   Tobacco Use  . Smoking status: Never Smoker  . Smokeless tobacco: Never Used  Substance Use Topics  . Alcohol use: No   family history includes Cancer in her paternal grandfather; Heart attack in her maternal grandfather; Heart disease in her maternal grandfather; Hyperlipidemia in her father and mother; Hypertension in her father and mother; Uterine cancer in her maternal grandmother.    ROS: negative except as noted in the HPI  Medications: Current Outpatient Medications  Medication Sig Dispense Refill  . acetaminophen (TYLENOL) 325 MG tablet Take 2 tablets (650 mg total) by mouth every 6 (six) hours as needed (for pain scale < 4). 90 tablet 0  . hydrocortisone-pramoxine (PROCTOFOAM-HC) rectal foam Place 1 applicator rectally 3 (three) times daily. 10 g 2  . ibuprofen (ADVIL,MOTRIN) 600 MG tablet Take 1 tablet (600 mg total) by mouth every 6 (six) hours as needed. 60 tablet 0  . NIFEdipine (PROCARDIA-XL/ADALAT CC) 60 MG 24 hr tablet  Take 1 tablet (60 mg total) by mouth daily. 30 tablet 5  . Prenatal Vit-Fe Fumarate-FA (PRENATAL MULTIVITAMIN) TABS tablet Take 1 tablet by mouth at bedtime.    Marland Kitchen ipratropium (ATROVENT) 0.06 % nasal spray Place 2 sprays into both nostrils 4 (four) times daily as needed. 15 mL 0   No current facility-administered medications for this visit.    No Known Allergies     Objective:  BP (!) 155/102   Pulse 82   Temp 98.5 F (36.9 C) (Oral)   Wt 180 lb (81.6 kg)   SpO2 96%   BMI 30.90 kg/m  Gen:  alert, not ill-appearing, no distress, appropriate for age 62: head normocephalic without obvious abnormality, conjunctiva and cornea clear, oropharynx with erythema, no exudates or edema, uvula midline, neck supple, no cervical adenopathy, trachea midline Pulm: Normal work of breathing, normal phonation, clear to auscultation bilaterally, no wheezes, rales or rhonchi CV: Normal rate, regular rhythm, s1 and s2 distinct, no murmurs, clicks or rubs  Neuro: alert and oriented x 3, no tremor MSK: extremities atraumatic, normal gait and station Skin: intact, no rashes on exposed skin, no jaundice, no cyanosis  No results found for this or any previous visit (from the past 72 hour(s)).  No results found.    Assessment and Plan: 32 y.o. female with   1. Viral upper respiratory illness - POCT Strep A negative, Centor score 2 - symptomatic care - ipratropium (ATROVENT) 0.06 % nasal spray; Place 2 sprays into both nostrils 4 (four) times daily as needed.  Dispense: 15 mL; Refill: 0   2. Postpartum hypertension BP Readings from Last 3 Encounters:  11/30/17 (!) 155/102  08/18/17 (!) 140/94  06/06/17 130/74  - BP out of range and patient is having dizziness. Increasing Nifedipine to 60 mg - NIFEdipine (PROCARDIA-XL/ADALAT CC) 60 MG 24 hr tablet; Take 1 tablet (60 mg total) by mouth daily.  Dispense: 30 tablet; Refill: 5   Patient education and anticipatory guidance given Patient agrees with  treatment plan Follow-up in 2 weeks for nurse BP check as needed if symptoms worsen or fail to improve  Darlyne Russian PA-C

## 2017-12-03 ENCOUNTER — Telehealth: Payer: Self-pay

## 2017-12-03 NOTE — Telephone Encounter (Signed)
Pt notified of recommendations.  She stated that usually when she is seen for a sinus infection she is Rx amoxicillin.  Pt Sx are swollen lymph nodes, sinus pressure, and coughing up tonsil stones. She stated that she could have gotten better service through an e-visit. Please advise.

## 2017-12-03 NOTE — Telephone Encounter (Signed)
-   I agree that she has a sinus infection. The vast majority of sinus infections are caused by rhinovirus, influenza and parainfluenza. Unfortunately, antibiotics for a virus don't make a difference. If I felt she needed antibiotics, I would have prescribed them. I have no reason to withhold medical treatment. - The normal course of this condition is 7-14 days, depending on the individual - Antibiotics can cause diarrhea, yeast infections, and allergic reactions. Some of these complications can be fatal. We want to use antibiotics when we know they will work . otherwise the risks outweigh the benefits. - Here are the warning signs of a complication: high fever, severe facial/dental pain, headache that does not respond to tylenol/ibuprofen, neck stiffness. If these happen, please come back in and let's take another look.

## 2017-12-03 NOTE — Telephone Encounter (Signed)
Pt stated that she was expecting an antibiotic to be sent to the pharmacy.  Please advise. -EH/RMA

## 2017-12-03 NOTE — Telephone Encounter (Signed)
Symptoms are consistent with a viral upper respiratory infection. No antibiotic therapy indicated.

## 2017-12-03 NOTE — Telephone Encounter (Signed)
Left vm for pt to return call to clinic -EH/RMA  

## 2017-12-05 ENCOUNTER — Encounter: Payer: Self-pay | Admitting: Physician Assistant

## 2017-12-07 ENCOUNTER — Telehealth: Payer: Self-pay

## 2017-12-07 DIAGNOSIS — B9689 Other specified bacterial agents as the cause of diseases classified elsewhere: Secondary | ICD-10-CM

## 2017-12-07 DIAGNOSIS — J019 Acute sinusitis, unspecified: Principal | ICD-10-CM

## 2017-12-07 MED ORDER — AMOXICILLIN-POT CLAVULANATE 875-125 MG PO TABS: 1.0000 | ORAL_TABLET | Freq: Two times a day (BID) | ORAL | 0 refills | Status: AC

## 2017-12-07 NOTE — Telephone Encounter (Signed)
Rx refill left on vm -EH/RMA

## 2017-12-07 NOTE — Telephone Encounter (Signed)
Pt states that her Sx are worse along with new Sx of nose bleeds and swollen lymph nodes on both sides.  She is wanting to know could she get an antibiotic sent to her pharmacy. -EH/RMA

## 2017-12-07 NOTE — Telephone Encounter (Signed)
Augmentin x 10 days sent to pharmacy

## 2017-12-17 ENCOUNTER — Ambulatory Visit: Payer: 59

## 2017-12-17 ENCOUNTER — Telehealth: Payer: Self-pay | Admitting: Osteopathic Medicine

## 2017-12-17 NOTE — Telephone Encounter (Signed)
I've only seen her once in 2017, no red flags. OK with me if ok with Dr Georgina Snell

## 2017-12-17 NOTE — Telephone Encounter (Signed)
OK for Patient to change. Please schedule.

## 2017-12-17 NOTE — Telephone Encounter (Signed)
Called and advised pt that Dr. Georgina Snell will be her pcp.

## 2017-12-17 NOTE — Telephone Encounter (Signed)
Pt stated she is currently and [facility] pt but her whole family sees Dr. Georgina Snell and so she Is wanting to switch to Dr. Georgina Snell for Primary Care as well. She said she has mentioned it to him before. Would this be ok to do ?

## 2017-12-17 NOTE — Telephone Encounter (Signed)
Ok with me 

## 2017-12-24 ENCOUNTER — Ambulatory Visit (INDEPENDENT_AMBULATORY_CARE_PROVIDER_SITE_OTHER): Payer: 59 | Admitting: Family Medicine

## 2017-12-24 ENCOUNTER — Encounter: Payer: Self-pay | Admitting: Family Medicine

## 2017-12-24 VITALS — BP 140/94 | HR 78 | Temp 98.2°F | Ht 64.0 in | Wt 181.0 lb

## 2017-12-24 DIAGNOSIS — Z6831 Body mass index (BMI) 31.0-31.9, adult: Secondary | ICD-10-CM

## 2017-12-24 DIAGNOSIS — R5383 Other fatigue: Secondary | ICD-10-CM

## 2017-12-24 DIAGNOSIS — E6609 Other obesity due to excess calories: Secondary | ICD-10-CM | POA: Diagnosis not present

## 2017-12-24 DIAGNOSIS — D649 Anemia, unspecified: Secondary | ICD-10-CM | POA: Diagnosis not present

## 2017-12-24 DIAGNOSIS — Z23 Encounter for immunization: Secondary | ICD-10-CM | POA: Diagnosis not present

## 2017-12-24 DIAGNOSIS — I10 Essential (primary) hypertension: Secondary | ICD-10-CM | POA: Diagnosis not present

## 2017-12-24 DIAGNOSIS — E669 Obesity, unspecified: Secondary | ICD-10-CM | POA: Insufficient documentation

## 2017-12-24 MED ORDER — HYDROCHLOROTHIAZIDE 25 MG PO TABS: 25.0000 mg | ORAL_TABLET | Freq: Every day | ORAL | 2 refills | Status: DC

## 2017-12-24 NOTE — Patient Instructions (Signed)
Thank you for coming in today. Get labs today.  Start HCTZ.  Recheck in 1 month to recheck blood pressure and follow up fatigue etc.  Work on reduced carbs and higher ratio of protein.  Goal calories is around 1500 per day.  Consider Dr Leafy Ro in [place]

## 2017-12-24 NOTE — Progress Notes (Signed)
Diana Fischer is a 32 y.o. female who presents to [place]: [place] today for hypertension, obesity, follow-up iron deficiency anemia and transaminitis.  Diana Fischer notes hypertension.  She has a history of gestational hypertension and hypertension following her pregnancy.  Previously she has been able to resolve her hypertension with weight loss following pregnancy but notes now 5 months after her delivery she is still having elevated blood pressure.  She is intolerant of nifedipine as it caused a rash and notes that labetalol was not very effective.  She denies chest pain palpitations shortness of breath.  Obesity: As noted above Diana Fischer has had difficulty losing weight.  She is trying to pay attention to her calories with a goal of about 1500 cal a day but having difficulty adhering to a rigorous diet on the weekends.  Additionally she has trouble finding time in her day to exercise.  Iron deficiency anemia and transaminitis noted on labs around delivery.  This was complicated by preeclampsia.  She feels pretty well but notes some fatigue.   Past Medical History:  Diagnosis Date  . Dizzy spells   . Gestational hypertension   . HTN (hypertension)   . Pre-eclampsia 11/13/2013   Past Surgical History:  Procedure Laterality Date  . NO PAST SURGERIES    . WISDOM TOOTH EXTRACTION     Social History   Tobacco Use  . Smoking status: Never Smoker  . Smokeless tobacco: Never Used  Substance Use Topics  . Alcohol use: No   family history includes Cancer in her paternal grandfather; Heart attack in her maternal grandfather; Heart disease in her maternal grandfather; Hyperlipidemia in her father and mother; Hypertension in her father and mother; Uterine cancer in her maternal grandmother.  ROS as above:  Medications: Current Outpatient Medications  Medication Sig  Dispense Refill  . acetaminophen (TYLENOL) 325 MG tablet Take 2 tablets (650 mg total) by mouth every 6 (six) hours as needed (for pain scale < 4). 90 tablet 0  . hydrochlorothiazide (HYDRODIURIL) 25 MG tablet Take 1 tablet (25 mg total) by mouth daily. 30 tablet 2  . ibuprofen (ADVIL,MOTRIN) 600 MG tablet Take 1 tablet (600 mg total) by mouth every 6 (six) hours as needed. 60 tablet 0  . ipratropium (ATROVENT) 0.06 % nasal spray Place 2 sprays into both nostrils 4 (four) times daily as needed. 15 mL 0  . Prenatal Vit-Fe Fumarate-FA (PRENATAL MULTIVITAMIN) TABS tablet Take 1 tablet by mouth at bedtime.     No current facility-administered medications for this visit.    No Known Allergies  Health Maintenance Health Maintenance  Topic Date Due  . TETANUS/TDAP  10/04/2003  . INFLUENZA VACCINE  04/11/2018  . PAP SMEAR  11/01/2018  . HIV Screening  Completed     Exam:  BP (!) 140/94   Pulse 78   Temp 98.2 F (36.8 C) (Oral)   Ht 5\' 4"  (1.626 m)   Wt 181 lb (82.1 kg)   BMI 31.07 kg/m  Gen: Well NAD HEENT: EOMI,  MMM Lungs: Normal work of breathing. CTABL Heart: RRR no MRG Abd: NABS, Soft. Nondistended, Nontender Exts: Brisk capillary refill, warm and well perfused.    Lab Results  Component Value Date   WBC 13.1 (H) 08/18/2017   HGB 9.6 (L) 08/18/2017   HCT 28.6 (L) 08/18/2017   MCV 94.1 08/18/2017   PLT 249 08/18/2017     Chemistry  Component Value Date/Time   NA 133 (L) 08/17/2017 0052   K 3.4 (L) 08/17/2017 0052   CL 103 08/17/2017 0052   CO2 20 (L) 08/17/2017 0052   BUN 7 08/17/2017 0052   CREATININE 0.46 08/17/2017 0052   CREATININE 0.64 06/10/2012 1011      Component Value Date/Time   CALCIUM 8.5 (L) 08/17/2017 0052   ALKPHOS 189 (H) 08/17/2017 0052   AST 21 08/17/2017 0052   ALT 13 (L) 08/17/2017 0052   BILITOT 0.2 (L) 08/17/2017 0052        Assessment and Plan: 32 y.o. female with  Hypertension: At this point not gestational or peripartum.   This is likely simple essential hypertension.  Plan for metabolic workup listed below as well as treatment with hydrochlorothiazide.  Will adjust as needed and recheck in 1 month.  Anemia and fatigue: Likely related to pregnancy but should be resolving by now.  Plan to check CBC and iron stores.  Obesity: Discussed strategies for weight loss.  Plan to check A1c and TSH as well.  Plan for calorie goal of around 1500 cal a day with some opportunistic exercise during the day.  Recheck in 1 month.  Tdap given prior to D/C  Orders Placed This Encounter  Procedures  . Tdap vaccine greater than or equal to 7yo IM  . CBC  . COMPLETE METABOLIC PANEL WITH GFR  . Hemoglobin A1c  . TSH  . Fe+TIBC+Fer   Meds ordered this encounter  Medications  . hydrochlorothiazide (HYDRODIURIL) 25 MG tablet    Sig: Take 1 tablet (25 mg total) by mouth daily.    Dispense:  30 tablet    Refill:  2     Discussed warning signs or symptoms. Please see discharge instructions. Patient expresses understanding.

## 2017-12-25 LAB — CBC
HEMATOCRIT: 39.7 % (ref 35.0–45.0)
HEMOGLOBIN: 13.1 g/dL (ref 11.7–15.5)
MCH: 28.4 pg (ref 27.0–33.0)
MCHC: 33 g/dL (ref 32.0–36.0)
MCV: 86.1 fL (ref 80.0–100.0)
MPV: 9.3 fL (ref 7.5–12.5)
Platelets: 436 10*3/uL — ABNORMAL HIGH (ref 140–400)
RBC: 4.61 10*6/uL (ref 3.80–5.10)
RDW: 12.4 % (ref 11.0–15.0)
WBC: 7.6 10*3/uL (ref 3.8–10.8)

## 2017-12-25 LAB — IRON,TIBC AND FERRITIN PANEL
%SAT: 8 % — AB (ref 11–50)
Ferritin: 6 ng/mL — ABNORMAL LOW (ref 10–154)
Iron: 30 ug/dL — ABNORMAL LOW (ref 40–190)
TIBC: 371 ug/dL (ref 250–450)

## 2017-12-25 LAB — COMPLETE METABOLIC PANEL WITH GFR
AG RATIO: 1.5 (calc) (ref 1.0–2.5)
ALBUMIN MSPROF: 4.5 g/dL (ref 3.6–5.1)
ALT: 19 U/L (ref 6–29)
AST: 20 U/L (ref 10–30)
Alkaline phosphatase (APISO): 114 U/L (ref 33–115)
BUN: 10 mg/dL (ref 7–25)
CO2: 30 mmol/L (ref 20–32)
Calcium: 9.9 mg/dL (ref 8.6–10.2)
Chloride: 104 mmol/L (ref 98–110)
Creat: 0.69 mg/dL (ref 0.50–1.10)
GFR, EST AFRICAN AMERICAN: 133 mL/min/{1.73_m2} (ref >=60)
GFR, Est Non African American: 114 mL/min/{1.73_m2} (ref >=60)
GLOBULIN: 3.1 g/dL (ref 1.9–3.7)
Glucose, Bld: 91 mg/dL (ref 65–139)
POTASSIUM: 4.2 mmol/L (ref 3.5–5.3)
SODIUM: 139 mmol/L (ref 135–146)
TOTAL PROTEIN: 7.6 g/dL (ref 6.1–8.1)
Total Bilirubin: 0.3 mg/dL (ref 0.2–1.2)

## 2017-12-25 LAB — HEMOGLOBIN A1C
Hgb A1c MFr Bld: 5.1 % of total Hgb (ref <5.7)
Mean Plasma Glucose: 100 (calc)
eAG (mmol/L): 5.5 (calc)

## 2017-12-25 LAB — TSH: TSH: 1.19 mIU/L

## 2018-01-11 ENCOUNTER — Telehealth: Payer: 59 | Admitting: Family

## 2018-01-11 DIAGNOSIS — N390 Urinary tract infection, site not specified: Secondary | ICD-10-CM | POA: Diagnosis not present

## 2018-01-11 DIAGNOSIS — R110 Nausea: Secondary | ICD-10-CM | POA: Diagnosis not present

## 2018-01-11 MED ORDER — CEPHALEXIN 500 MG PO CAPS: 500.0000 mg | ORAL_CAPSULE | Freq: Two times a day (BID) | ORAL | 0 refills | Status: DC

## 2018-01-11 MED ORDER — ONDANSETRON 4 MG PO TBDP: 4.0000 mg | ORAL_TABLET | Freq: Three times a day (TID) | ORAL | 0 refills | Status: DC | PRN

## 2018-01-11 NOTE — Progress Notes (Signed)
Thank you for the details you included in the comment boxes. Those details are very helpful in determining the best course of treatment for you and help [place] to provide the best care. The gas cannot be related, but I can send Zofran for nausea also. 4mg  tablets, every 8 hours as needed for nausea.   We are sorry that you are not feeling well.  Here is how we plan to help!  Based on what you shared with me it looks like you most likely have a simple urinary tract infection.  A UTI (Urinary Tract Infection) is a bacterial infection of the bladder.  Most cases of urinary tract infections are simple to treat but a key part of your care is to encourage you to drink plenty of fluids and watch your symptoms carefully.  I have prescribed Keflex 500 mg twice a day for 7 days.  Your symptoms should gradually improve. Call us if the burning in your urine worsens, you develop worsening fever, back pain or pelvic pain or if your symptoms do not resolve after completing the antibiotic.  Urinary tract infections can be prevented by drinking plenty of water to keep your body hydrated.  Also be sure when you wipe, wipe from front to back and don't hold it in!  If possible, empty your bladder every 4 hours.  Your e-visit answers were reviewed by a board certified advanced clinical practitioner to complete your personal care plan.  Depending on the condition, your plan could have included both over the counter or prescription medications.  If there is a problem please reply  once you have received a response from your provider.  Your safety is important to [place].  If you have drug allergies check your prescription carefully.    You can use MyChart to ask questions about today's visit, request a non-urgent call back, or ask for a work or school excuse for 24 hours related to this e-Visit. If it has been greater than 24 hours you will need to follow up with your provider, or enter a new e-Visit to address those  concerns.   You will get an e-mail in the next two days asking about your experience.  I hope that your e-visit has been valuable and will speed your recovery. Thank you for using e-visits.

## 2018-01-21 ENCOUNTER — Ambulatory Visit: Payer: 59 | Admitting: Family Medicine

## 2018-02-06 ENCOUNTER — Encounter: Payer: Self-pay | Admitting: Family Medicine

## 2018-02-07 MED ORDER — NORETHIN ACE-ETH ESTRAD-FE 1-20 MG-MCG PO TABS: 1.0000 | ORAL_TABLET | Freq: Every day | ORAL | 11 refills | Status: DC

## 2018-02-12 MED ORDER — LORCASERIN HCL 10 MG PO TABS: 1.0000 | ORAL_TABLET | Freq: Two times a day (BID) | ORAL | 1 refills | Status: DC

## 2018-02-14 ENCOUNTER — Telehealth: Payer: Self-pay | Admitting: Family Medicine

## 2018-02-14 NOTE — Telephone Encounter (Signed)
Belviq has been approved and pharmacy notified. Form sent to scan.

## 2018-02-20 MED FILL — HYDROCHLOROTHIAZIDE 25 MG T: 25 | 30 days supply | Qty: 30 | Fill #0

## 2018-03-02 ENCOUNTER — Encounter: Payer: Self-pay | Admitting: Family Medicine

## 2018-03-05 MED ORDER — LORCASERIN HCL 10 MG PO TABS: 1.0000 | ORAL_TABLET | Freq: Two times a day (BID) | ORAL | 1 refills | Status: DC

## 2018-03-15 MED FILL — BELVIQ 10 MG TABLET: 10 | 30 days supply | Qty: 60 | Fill #0

## 2018-03-25 ENCOUNTER — Other Ambulatory Visit: Payer: Self-pay | Admitting: Family Medicine

## 2018-03-25 DIAGNOSIS — I10 Essential (primary) hypertension: Secondary | ICD-10-CM

## 2018-03-25 MED FILL — HYDROCHLOROTHIAZIDE 25 MG T: 25 | 30 days supply | Qty: 30 | Fill #0

## 2018-04-16 ENCOUNTER — Other Ambulatory Visit: Payer: Self-pay | Admitting: Family Medicine

## 2018-04-16 DIAGNOSIS — I10 Essential (primary) hypertension: Secondary | ICD-10-CM

## 2018-04-16 MED FILL — LARIN 21 1-20 TABLET: 1-20 | 21 days supply | Qty: 21 | Fill #0

## 2018-04-18 MED FILL — HYDROCHLOROTHIAZIDE 25 MG T: 25 | 30 days supply | Qty: 30 | Fill #0

## 2018-05-17 MED FILL — LARIN 21 1-20 TABLET: 1-20 | 21 days supply | Qty: 21 | Fill #1

## 2018-05-31 ENCOUNTER — Other Ambulatory Visit: Payer: Self-pay | Admitting: Family Medicine

## 2018-05-31 DIAGNOSIS — I10 Essential (primary) hypertension: Secondary | ICD-10-CM

## 2018-05-31 MED FILL — HYDROCHLOROTHIAZIDE 25 MG T: 25 | 30 days supply | Qty: 30 | Fill #0

## 2018-06-13 MED FILL — LARIN 21 1-20 TABLET: 1-20 | 84 days supply | Qty: 63 | Fill #2 | Status: TO

## 2018-07-02 ENCOUNTER — Encounter: Payer: Self-pay | Admitting: Family Medicine

## 2018-07-02 ENCOUNTER — Ambulatory Visit (INDEPENDENT_AMBULATORY_CARE_PROVIDER_SITE_OTHER): Payer: Self-pay

## 2018-07-02 ENCOUNTER — Ambulatory Visit (INDEPENDENT_AMBULATORY_CARE_PROVIDER_SITE_OTHER): Payer: Self-pay | Admitting: Family Medicine

## 2018-07-02 VITALS — BP 150/87 | HR 89 | Ht 64.0 in | Wt 179.0 lb

## 2018-07-02 DIAGNOSIS — M25562 Pain in left knee: Secondary | ICD-10-CM

## 2018-07-02 DIAGNOSIS — M25462 Effusion, left knee: Secondary | ICD-10-CM

## 2018-07-02 DIAGNOSIS — M7989 Other specified soft tissue disorders: Secondary | ICD-10-CM

## 2018-07-02 DIAGNOSIS — I10 Essential (primary) hypertension: Secondary | ICD-10-CM

## 2018-07-02 DIAGNOSIS — M7122 Synovial cyst of popliteal space [Baker], left knee: Secondary | ICD-10-CM

## 2018-07-02 DIAGNOSIS — L989 Disorder of the skin and subcutaneous tissue, unspecified: Secondary | ICD-10-CM

## 2018-07-02 MED ORDER — DICLOFENAC SODIUM 1 % TD GEL: 4.0000 g | Freq: Four times a day (QID) | TRANSDERMAL | 11 refills | Status: DC

## 2018-07-02 NOTE — Patient Instructions (Signed)
Thank you for coming in today. Get xray now on the way out.  Use compression knee sleeve.  I recommend Body Helix full knee.   Use diclofenac gel up to 4x daily for pain.  If not improving next step is injection.    Baker Cyst A Baker cyst, also called a popliteal cyst, is a sac-like growth that forms at the back of the knee. The cyst forms when the fluid-filled sac (bursa) that cushions the knee joint becomes enlarged. The bursa that becomes a Baker cyst is located at the back of the knee joint. What are the causes? In most cases, a Baker cyst results from another knee problem that causes swelling inside the knee. This makes the fluid inside the knee joint (synovial fluid) flow into the bursa behind the knee, causing the bursa to enlarge. What increases the risk? You may be more likely to develop a Baker cyst if you already have a knee problem, such as:  A tear in cartilage that cushions the knee joint (meniscal tear).  A tear in the tissues that connect the bones of the knee joint (ligament tear).  Knee swelling from osteoarthritis, rheumatoid arthritis, or gout.  What are the signs or symptoms? A Baker cyst does not always cause symptoms. A lump behind the knee may be the only sign of the condition. The lump may be painful, especially when the knee is straightened. If the lump is painful, the pain may come and go. The knee may also be stiff. Symptoms may quickly get more severe if the cyst breaks open (ruptures). If your cyst ruptures, signs and symptoms may affect the knee and the back of the lower leg (calf) and may include:  Sudden or worsening pain.  Swelling.  Bruising.  How is this diagnosed? This condition may be diagnosed based on your symptoms and medical history. Your health care provider will also do a physical exam. This may include:  Feeling the cyst to check whether it is tender.  Checking your knee for signs of another knee condition that causes swelling.  You  may have imaging tests, such as:  X-rays.  MRI.  Ultrasound.  How is this treated? A Baker cyst that is not painful may go away without treatment. If the cyst gets large or painful, it will likely get better if the underlying knee problem is treated. Treatment for a Baker cyst may include:  Resting.  Keeping weight off of the knee. This means not leaning on the knee to support your body weight.  NSAIDs to reduce pain and swelling.  A procedure to drain the fluid from the cyst with a needle (aspiration). You may also get an injection of a medicine that reduces swelling (steroid).  Surgery. This may be needed if other treatments do not work. This usually involves correcting knee damage and removing the cyst.  Follow these instructions at home:  Take over-the-counter and prescription medicines only as told by your health care provider.  Rest and return to your normal activities as told by your health care provider. Avoid activities that make pain or swelling worse. Ask your health care provider what activities are safe for you.  Keep all follow-up visits as told by your health care provider. This is important. Contact a health care provider if:  You have knee pain, stiffness, or swelling that does not get better. Get help right away if:  You have sudden or worsening pain and swelling in your calf area. This information is not  intended to replace advice given to you by your health care provider. Make sure you discuss any questions you have with your health care provider. Document Released: 08/28/2005 Document Revised: 05/18/2016 Document Reviewed: 05/18/2016 Elsevier Interactive Patient Education  2018 [employer].

## 2018-07-02 NOTE — Progress Notes (Signed)
Diana Fischer is a 32 y.o. female who presents to [facility]: [facility] today for face lesion and left knee pain.  Britanny notes a macular lesion on her left face that is hypopigmented and slowly worsening over the last few months.  She notes is not particularly painful or tender.  She is tried some topical over-the-counter creams that have not helped much.  No fevers or chills.  She suspects that it is a benign skin lesion but is a little concerned.  She notes left knee pain ongoing now for a few weeks.  She notes pain is located originally in the posterior knee.  She had some swelling and eventually had worsening pain with flexion and subsequently developed calf pain.  She notes the posterior medial knee pain and calf pain are improving but still somewhat present mildly.  She did some reading and suspects that she had a ruptured Baker's cyst.  She has tried some over-the-counter medications for pain which helped a little.  She feels well otherwise.   ROS as above:  Exam:  BP (!) 150/87   Pulse 89   Ht 5\' 4"  (1.626 m)   Wt 179 lb (81.2 kg)   LMP 07/01/2018   BMI 30.73 kg/m  Wt Readings from Last 5 Encounters:  07/02/18 179 lb (81.2 kg)  12/24/17 181 lb (82.1 kg)  11/30/17 180 lb (81.6 kg)  08/17/17 203 lb (92.1 kg)  06/06/17 191 lb 1.9 oz (86.7 kg)    Gen: Well NAD HEENT: EOMI,  MMM Lungs: Normal work of breathing. CTABL Heart: RRR no MRG Abd: NABS, Soft. Nondistended, Nontender Exts: Brisk capillary refill, warm and well perfused.  Skin: 1 cm macular mildly hyperpigmented skin lesion on left face.  Nontender. MSK: Left knee mild effusion no deformities no erythema. Range of motion 0-120 degrees without significant retropatellar crepitations. Stable ligamentous exam. Intact flexion and extension strength.  Left calf normal-appearing minimally tender medial  gastrocnemius midportion without masses palpated.  No palpable cords no significant calf swelling.  Pulses capillary refill sensation are intact.  Lab and Radiology Results No results found for this or any previous visit (from the past 72 hour(s)). Dg Knee 1-2 Views Right  Result Date: 07/02/2018 CLINICAL DATA:  Pain swelling.  No known injury. EXAM: RIGHT KNEE - 1-2 VIEW COMPARISON:  No recent prior. FINDINGS: No acute soft tissue or bony abnormality identified. No evidence of fracture or dislocation. No acute bony abnormality identified. IMPRESSION: No acute abnormality. Electronically Signed   By: Marcello Moores  Register   On: 07/02/2018 17:00   Dg Knee Complete 4 Views Left  Result Date: 07/02/2018 CLINICAL DATA:  Left knee pain and swelling for 3 weeks, no injury EXAM: LEFT KNEE - COMPLETE 4+ VIEW COMPARISON:  None. FINDINGS: Standing views of both knees were obtained showing normal medial and lateral compartments. Lateral view of the left knee shows a normal patellofemoral compartment and the patella is in normal position. No fracture or joint effusion is seen. IMPRESSION: Negative left knee. Electronically Signed   By: Ivar Drape M.D.   On: 07/02/2018 16:56  I personally (independently) visualized and performed the interpretation of the images attached in this note.  Limited musculoskeletal ultrasound of left knee and calf. Posterior medial knee reveals a small Baker's cyst less than 1 cm.  Additionally patient has a small joint effusion.  Otherwise bony structures are normal and on knee exam with ultrasound.  Patient in  the medial calf has subcutaneous small amounts of hypoechoic fluid tracking underneath the fascia consistent with ruptured Baker's cyst.  Veins are compressible.  Impression: Ruptured Baker's cyst very doubtful for DVT.    Assessment and Plan: 32 y.o. female with  Left knee pain and calf pain: Likely ruptured Baker's cyst.  Patient without significant DJD seen on x-ray.   Discussed options.  Plan for trial of diclofenac gel and compressive knee sleeve.  Recheck if not improving next step would be injection.  Macular skin lesion: Somewhat consistent appearance with seborrheic keratosis over patient is a bit young for that.  Given the location and the fact that is a changing hyperpigmented skin lesion I think it is worthwhile have an evaluation with dermatology.  She may benefit from skin biopsy but I like to avoid that on her face if possible.  Blood pressure elevated recheck at work and keep blood pressure log.  Orders Placed This Encounter  Procedures  . DG Knee Complete 4 Views Left    Please include patellar sunrise, lateral, and weightbearing bilateral AP and bilateral rosenberg views    Standing Status:   Future    Number of Occurrences:   1    Standing Expiration Date:   09/01/2019    Order Specific Question:   Reason for exam:    Answer:   Please include patellar sunrise, lateral, and weightbearing bilateral AP and bilateral rosenberg views    Comments:   Please include patellar sunrise, lateral, and weightbearing bilateral AP and bilateral rosenberg views    Order Specific Question:   Preferred imaging location?    Answer:   [facility]  . DG Knee 1-2 Views Right    Standing Status:   Future    Number of Occurrences:   1    Standing Expiration Date:   09/02/2019    Order Specific Question:   Reason for Exam (SYMPTOM  OR DIAGNOSIS REQUIRED)    Answer:   For use with the left knee x-ray bilateral AP and Rosenberg standing.    Order Specific Question:   Is the patient pregnant?    Answer:   No    Order Specific Question:   Preferred imaging location?    Answer:   [facility]  . Ambulatory referral to Dermatology    Referral Priority:   Routine    Referral Type:   Consultation    Referral Reason:   Specialty Services Required    Requested Specialty:   Dermatology    Number of Visits Requested:   1   Meds ordered this  encounter  Medications  . diclofenac sodium (VOLTAREN) 1 % GEL    Sig: Apply 4 g topically 4 (four) times daily. To affected joint.    Dispense:  100 g    Refill:  11     Historical information moved to improve visibility of documentation.  Past Medical History:  Diagnosis Date  . Dizzy spells   . Gestational hypertension   . HTN (hypertension)   . Pre-eclampsia 11/13/2013   Past Surgical History:  Procedure Laterality Date  . NO PAST SURGERIES    . WISDOM TOOTH EXTRACTION     Social History   Tobacco Use  . Smoking status: Never Smoker  . Smokeless tobacco: Never Used  Substance Use Topics  . Alcohol use: No   family history includes Cancer in her paternal grandfather; Heart attack in her maternal grandfather; Heart disease in her maternal grandfather; Hyperlipidemia in her father and  mother; Hypertension in her father and mother; Uterine cancer in her maternal grandmother.  Medications: Current Outpatient Medications  Medication Sig Dispense Refill  . acetaminophen (TYLENOL) 325 MG tablet Take 2 tablets (650 mg total) by mouth every 6 (six) hours as needed (for pain scale < 4). 90 tablet 0  . hydrochlorothiazide (HYDRODIURIL) 25 MG tablet Take 1 tablet (25 mg total) by mouth daily. APPOINTMENT NEEDED FOR FURTHER REFILLS 30 tablet 0  . ibuprofen (ADVIL,MOTRIN) 600 MG tablet Take 1 tablet (600 mg total) by mouth every 6 (six) hours as needed. 60 tablet 0  . ipratropium (ATROVENT) 0.06 % nasal spray Place 2 sprays into both nostrils 4 (four) times daily as needed. 15 mL 0  . Lorcaserin HCl 10 MG TABS Take 1 tablet by mouth 2 (two) times daily. 180 tablet 1  . norethindrone-ethinyl estradiol (JUNEL FE 1/20) 1-20 MG-MCG tablet Take 1 tablet by mouth daily. 1 Package 11  . ondansetron (ZOFRAN ODT) 4 MG disintegrating tablet Take 1 tablet (4 mg total) by mouth every 8 (eight) hours as needed for nausea or vomiting. 20 tablet 0  . Prenatal Vit-Fe Fumarate-FA (PRENATAL MULTIVITAMIN)  TABS tablet Take 1 tablet by mouth at bedtime.    . diclofenac sodium (VOLTAREN) 1 % GEL Apply 4 g topically 4 (four) times daily. To affected joint. 100 g 11   No current facility-administered medications for this visit.    No Known Allergies   Discussed warning signs or symptoms. Please see discharge instructions. Patient expresses understanding.

## 2018-07-03 ENCOUNTER — Encounter: Payer: Self-pay | Admitting: Family Medicine

## 2018-07-05 ENCOUNTER — Telehealth: Payer: Self-pay | Admitting: Family Medicine

## 2018-07-05 DIAGNOSIS — M7989 Other specified soft tissue disorders: Secondary | ICD-10-CM

## 2018-07-05 NOTE — Telephone Encounter (Signed)
Will obtain ultrasound to evaluate and rule out for DVT.  DVT very unlikely however once ultrasound negative if swelling still present return to clinic will proceed with injection.

## 2018-07-05 NOTE — Telephone Encounter (Signed)
Patient called and states that she is having some swelling on the top part of left knee and some pain going down her calf. She states that she was standing more yesterday and is not sure if this is causing some of the swelling and pain. She has tried to rest as much as she can to help the swelling. Please advise.

## 2018-07-05 NOTE — Telephone Encounter (Signed)
Called patient and she voices understanding. She did not have any further questions. Patient was transferred to scheduling.

## 2018-07-08 ENCOUNTER — Ambulatory Visit: Payer: Self-pay

## 2018-07-08 DIAGNOSIS — M7989 Other specified soft tissue disorders: Secondary | ICD-10-CM

## 2018-07-12 ENCOUNTER — Encounter: Payer: Self-pay | Admitting: Family Medicine

## 2018-07-15 ENCOUNTER — Encounter: Payer: Self-pay | Admitting: Family Medicine

## 2018-07-15 ENCOUNTER — Ambulatory Visit (INDEPENDENT_AMBULATORY_CARE_PROVIDER_SITE_OTHER): Payer: Self-pay | Admitting: Family Medicine

## 2018-07-15 VITALS — BP 143/100 | HR 98 | Ht 64.0 in | Wt 181.0 lb

## 2018-07-15 DIAGNOSIS — M25462 Effusion, left knee: Secondary | ICD-10-CM

## 2018-07-15 DIAGNOSIS — M1180 Other specified crystal arthropathies, unspecified site: Secondary | ICD-10-CM

## 2018-07-15 DIAGNOSIS — I10 Essential (primary) hypertension: Secondary | ICD-10-CM

## 2018-07-15 MED ORDER — HYDROCHLOROTHIAZIDE 25 MG PO TABS: 25.0000 mg | ORAL_TABLET | Freq: Every day | ORAL | 3 refills | Status: DC

## 2018-07-15 NOTE — Progress Notes (Signed)
Diana Fischer is a 32 y.o. female who presents to [facility] today for left knee pain and swelling.  Patient notes ongoing knee pain and swelling interfering with work.  She is limping at home and at work and notes pain is worse with activity.  She notes some knee swelling as previously discussed.  No significant radiating pain weakness or numbness.  She feels well otherwise no fevers chills nausea vomiting or diarrhea.    ROS:  As above  Exam:  BP (!) 143/100   Pulse 98   Ht 5\' 4"  (1.626 m)   Wt 181 lb (82.1 kg)   LMP 07/01/2018   BMI 31.07 kg/m  General: Well Developed, well nourished, and in no acute distress.  Neuro/Psych: Alert and oriented x3, extra-ocular muscles intact, able to move all 4 extremities, sensation grossly intact. Skin: Warm and dry, no rashes noted.  Respiratory: Not using accessory muscles, speaking in full sentences, trachea midline.  Cardiovascular: Pulses palpable, no extremity edema. Abdomen: Does not appear distended. MSK: Left knee mild effusion otherwise normal-appearing Normal range of motion without significant retropatellar crepitation. Stable ligamentous exam.    Lab and Radiology Results Limited musculoskeletal ultrasound does show moderate effusion  Left knee aspiration and injection Consent obtained and timeout performed. Skin cleaned with rubbing alcohol cold spray applied and 1 mL of lidocaine injected subcutaneously achieving good anesthesia. Skin was again sterilized with rubbing alcohol and chlorhexidine. 18-gauge needle used to access the superior lateral patellar space. No fluid was aspirated.  Procedure was discontinued. New needle and syringe obtained and using ultrasound guidance the needle was guided to a pocket of fluid.  5 mL of cloudy yellowish fluid was aspirated. The syringe was exchanged and 80 mg of Depo-Medrol and 4 mL of Marcaine were injected. Patient tolerated the  procedure well.    Assessment and Plan: 32 y.o. female with  Left knee pain and effusion.  Unclear etiology.  X-ray last month was unremarkable.  Plan to send fluid aspirate off for cell count differential and crystal analysis and culture.  Injection as above.  If not improving next step would be MRI.  Will fill out work note and FMLA form as well.  Recheck if needed sooner.   Hydrochlorothiazide refilled Orders Placed This Encounter  Procedures  . Anaerobic and Aerobic Culture  . Synovial cell count + diff, w/ crystals   Meds ordered this encounter  Medications  . hydrochlorothiazide (HYDRODIURIL) 25 MG tablet    Sig: Take 1 tablet (25 mg total) by mouth daily.    Dispense:  90 tablet    Refill:  3    Historical information moved to improve visibility of documentation.  Past Medical History:  Diagnosis Date  . Dizzy spells   . Gestational hypertension   . HTN (hypertension)   . Pre-eclampsia 11/13/2013   Past Surgical History:  Procedure Laterality Date  . NO PAST SURGERIES    . WISDOM TOOTH EXTRACTION     Social History   Tobacco Use  . Smoking status: Never Smoker  . Smokeless tobacco: Never Used  Substance Use Topics  . Alcohol use: No   family history includes Cancer in her paternal grandfather; Heart attack in her maternal grandfather; Heart disease in her maternal grandfather; Hyperlipidemia in her father and mother; Hypertension in her father and mother; Uterine cancer in her maternal grandmother.  Medications: Current Outpatient Medications  Medication Sig Dispense Refill  . acetaminophen (TYLENOL) 325 MG tablet  Take 2 tablets (650 mg total) by mouth every 6 (six) hours as needed (for pain scale < 4). 90 tablet 0  . diclofenac sodium (VOLTAREN) 1 % GEL Apply 4 g topically 4 (four) times daily. To affected joint. 100 g 11  . hydrochlorothiazide (HYDRODIURIL) 25 MG tablet Take 1 tablet (25 mg total) by mouth daily. 90 tablet 3  . ibuprofen (ADVIL,MOTRIN) 600  MG tablet Take 1 tablet (600 mg total) by mouth every 6 (six) hours as needed. 60 tablet 0  . ipratropium (ATROVENT) 0.06 % nasal spray Place 2 sprays into both nostrils 4 (four) times daily as needed. 15 mL 0  . Lorcaserin HCl 10 MG TABS Take 1 tablet by mouth 2 (two) times daily. 180 tablet 1  . norethindrone-ethinyl estradiol (JUNEL FE 1/20) 1-20 MG-MCG tablet Take 1 tablet by mouth daily. 1 Package 11  . ondansetron (ZOFRAN ODT) 4 MG disintegrating tablet Take 1 tablet (4 mg total) by mouth every 8 (eight) hours as needed for nausea or vomiting. 20 tablet 0  . Prenatal Vit-Fe Fumarate-FA (PRENATAL MULTIVITAMIN) TABS tablet Take 1 tablet by mouth at bedtime.     No current facility-administered medications for this visit.    No Known Allergies    Discussed warning signs or symptoms. Please see discharge instructions. Patient expresses understanding.

## 2018-07-15 NOTE — Patient Instructions (Signed)
Thank you for coming in today. Call or go to the ER if you develop a large red swollen joint with extreme pain or oozing puss.  You should start to feel better soon.  If not getting better next step is MRI.   Knee Effusion Knee effusion means that you have excess fluid in your knee joint. This can cause pain and swelling in your knee. This may make your knee more difficult to bend and move. That is because there is increased pain and pressure in the joint. If there is fluid in your knee, it often means that something is wrong inside your knee, such as severe arthritis, abnormal inflammation, or an infection. Another common cause of knee effusion is an injury to the knee muscles, ligaments, or cartilage. Follow these instructions at home:  Use crutches as directed by your health care provider.  Wear a knee brace as directed by your health care provider.  Apply ice to the swollen area: ? Put ice in a plastic bag. ? Place a towel between your skin and the bag. ? Leave the ice on for 20 minutes, 2-3 times per day.  Keep your knee raised (elevated) when you are sitting or lying down.  Take medicines only as directed by your health care provider.  Do any rehabilitation or strengthening exercises as directed by your health care provider.  Rest your knee as directed by your health care provider. You may start doing your normal activities again when your health care provider approves.  Keep all follow-up visits as directed by your health care provider. This is important. Contact a health care provider if:  You have ongoing (persistent) pain in your knee. Get help right away if:  You have increased swelling or redness of your knee.  You have severe pain in your knee.  You have a fever. This information is not intended to replace advice given to you by your health care provider. Make sure you discuss any questions you have with your health care provider. Document Released: 11/18/2003  Document Revised: 02/03/2016 Document Reviewed: 04/13/2014 Elsevier Interactive Patient Education  2018 [employer].

## 2018-07-16 ENCOUNTER — Encounter: Payer: Self-pay | Admitting: Family Medicine

## 2018-07-16 ENCOUNTER — Telehealth: Payer: Self-pay

## 2018-07-16 DIAGNOSIS — M1180 Other specified crystal arthropathies, unspecified site: Secondary | ICD-10-CM

## 2018-07-16 HISTORY — DX: Other specified crystal arthropathies, unspecified site: M11.80

## 2018-07-16 MED ORDER — COLCHICINE 0.6 MG PO TABS: 0.6000 mg | ORAL_TABLET | Freq: Every day | ORAL | 1 refills | Status: DC | PRN

## 2018-07-16 NOTE — Telephone Encounter (Signed)
Patient called stated that since her injection on yesterday she has had extremely flushed face and joint pain. She stated that she has taken Ibuprofen which has not helped her. Please advise. Shan Padgett,CMA

## 2018-07-16 NOTE — Addendum Note (Signed)
Addended by: Gregor Hams on: 07/16/2018 02:14 PM   Modules accepted: Orders

## 2018-07-17 MED ORDER — COLCHICINE 0.6 MG PO CAPS: 1.0000 | ORAL_CAPSULE | Freq: Every day | ORAL | 1 refills | Status: DC

## 2018-07-17 NOTE — Telephone Encounter (Signed)
Left detailed message on patient vm with advise as noted below. Lazar Tierce,CMA  

## 2018-07-17 NOTE — Telephone Encounter (Signed)
That can happen with the steroid injection. It should improve in 1-2 days or so.

## 2018-07-21 LAB — ANAEROBIC AND AEROBIC CULTURE
AER RESULT:: NO GROWTH
MICRO NUMBER: 91326057
MICRO NUMBER: 91326058
SPECIMEN QUALITY: ADEQUATE
SPECIMEN QUALITY:: ADEQUATE

## 2018-07-21 LAB — SYNOVIAL CELL COUNT + DIFF, W/ CRYSTALS
BASOPHILS, %: 0 %
EOSINOPHILS-SYNOVIAL: 1 % (ref 0–2)
Lymphocytes-Synovial Fld: 68 % (ref 0–74)
MONOCYTE/MACROPHAGE: 20 % (ref 0–69)
NEUTROPHIL, SYNOVIAL: 11 % (ref 0–24)
Synoviocytes, %: 0 % (ref 0–15)
WBC, SYNOVIAL: 6218 {cells}/uL — AB (ref <150)

## 2018-08-16 ENCOUNTER — Other Ambulatory Visit: Payer: Self-pay | Admitting: Family Medicine

## 2018-08-21 ENCOUNTER — Encounter: Payer: Self-pay | Admitting: Family Medicine

## 2018-08-22 ENCOUNTER — Encounter: Payer: Self-pay | Admitting: Family Medicine

## 2018-09-01 ENCOUNTER — Other Ambulatory Visit: Payer: Self-pay | Admitting: Family Medicine

## 2018-10-23 ENCOUNTER — Encounter: Payer: Self-pay | Admitting: Family Medicine

## 2018-10-23 ENCOUNTER — Ambulatory Visit (INDEPENDENT_AMBULATORY_CARE_PROVIDER_SITE_OTHER): Payer: Self-pay | Admitting: Family Medicine

## 2018-10-23 VITALS — BP 133/96 | HR 89 | Wt 180.0 lb

## 2018-10-23 DIAGNOSIS — I10 Essential (primary) hypertension: Secondary | ICD-10-CM

## 2018-10-23 MED ORDER — LISINOPRIL 20 MG PO TABS: 20.0000 mg | ORAL_TABLET | Freq: Every day | ORAL | 2 refills | Status: DC

## 2018-10-23 NOTE — Progress Notes (Signed)
Diana Fischer is a 33 y.o. female who presents to [facility]: [facility] today for hypertension.  Mirel has hypertension that has been moderately controlled with hydrochlorothiazide.  She has had several problems with it and she like to consider switching.  She notes that she is been losing hair after starting hydrochlorothiazide which is not usual for her.  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  She is concerned that the hydrochlorothiazide may be causing some of these problems.  She is done having children after her husband has had a vasectomy and is willing to consider other blood pressure medications.  Her for other family members have done extremely well on lisinopril.   ROS as above:  Exam:  BP (!) 133/96   Pulse 89   Wt 180 lb (81.6 kg)   BMI 30.90 kg/m  Wt Readings from Last 5 Encounters:  10/23/18 180 lb (81.6 kg)  06/06/17 191 lb 1.9 oz (86.7 kg)  05/07/17 189 lb (85.7 kg)  04/20/17 184 lb 8 oz (83.7 kg)  07/28/16 160 lb (72.6 kg)    Gen: Well NAD HEENT: EOMI,  MMM Lungs: Normal work of breathing. CTABL Heart: RRR no MRG Abd: NABS, Soft. Nondistended, Nontender Exts: Brisk capillary refill, warm and well perfused.   Lab and Radiology Results   Chemistry      Component Value Date/Time   NA 139 12/24/2017 1200   K 4.2 12/24/2017 1200   CL 104 12/24/2017 1200   CO2 30 12/24/2017 1200   BUN 10 12/24/2017 1200   CREATININE 0.69 12/24/2017 1200      Component Value Date/Time   CALCIUM 9.9 12/24/2017 1200   ALKPHOS 189 (H) 08/17/2017 0052   AST 20 12/24/2017 1200   ALT 19 12/24/2017 1200   BILITOT 0.3 12/24/2017 1200         Assessment and Plan: 33 y.o. female with hypertension: Not very well controlled on hydrochlorothiazide and patient is experiencing several problems it could possibly be related to hydrochlorothiazide.  Plan  to switch to lisinopril 20 mg daily.  Plan to keep home blood pressure log and recheck for nurse visit in about 1 month.  Will obtain basic metabolic panel at that visit if all is well.  PDMP not reviewed this encounter. Orders Placed This Encounter  Procedures  . BASIC METABOLIC PANEL WITH GFR   Meds ordered this encounter  Medications  . lisinopril (PRINIVIL,ZESTRIL) 20 MG tablet    Sig: Take 1 tablet (20 mg total) by mouth daily.    Dispense:  30 tablet    Refill:  2     Historical information moved to improve visibility of documentation.  Past Medical History:  Diagnosis Date  . 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 Dizzy spells   . Gestational hypertension   . HTN (hypertension)   . Pre-eclampsia 11/13/2013   Past Surgical History:  Procedure Laterality Date  . NO PAST SURGERIES    . WISDOM TOOTH EXTRACTION     Social History   Tobacco Use  . Smoking status: Never Smoker  . Smokeless tobacco: Never Used  Substance Use Topics  . Alcohol use: No   family history includes Cancer in her paternal grandfather; Heart attack in her maternal grandfather; Heart disease in her maternal grandfather; Hyperlipidemia in her father and mother; Hypertension in her father and mother; Uterine cancer in her maternal grandmother.  Medications: Current Outpatient  Medications  Medication Sig Dispense Refill  . acetaminophen (TYLENOL) 325 MG tablet Take 2 tablets (650 mg total) by mouth every 6 (six) hours as needed (for pain scale < 4). 90 tablet 0  . Colchicine 0.6 MG CAPS TAKE 1 CAPSULE BY MOUTH EVERY DAY 90 capsule 1  . ibuprofen (ADVIL,MOTRIN) 600 MG tablet Take 1 tablet (600 mg total) by mouth every 6 (six) hours as needed. 60 tablet 0  . Multiple Vitamins-Minerals (MULTIVITAMIN GUMMIES WOMENS PO) Take 2 capsules by mouth daily.    Marland Kitchen lisinopril (PRINIVIL,ZESTRIL) 20 MG tablet Take 1 tablet (20 mg total) by mouth daily. 30 tablet 2   No current  facility-administered medications for this visit.    No Known Allergies   Discussed warning signs or symptoms. Please see discharge instructions. Patient expresses understanding.

## 2018-10-23 NOTE — Patient Instructions (Addendum)
Thank you for coming in today. Take lisinopril daily. Consider taking at bedtime.  STOP HCTZ.   Recheck blood pressure and labs in about 1 month.   Schedule nurse visit in 1 month.    Lisinopril tablets What is this medicine? LISINOPRIL (lyse IN oh pril) is an ACE inhibitor. This medicine is used to treat high blood pressure and heart failure. It is also used to protect the heart immediately after a heart attack. This medicine may be used for other purposes; ask your health care provider or pharmacist if you have questions. COMMON BRAND NAME(S): Prinivil, Zestril What should I tell my health care provider before I take this medicine? They need to know if you have any of these conditions: -diabetes -heart or blood vessel disease -kidney disease -low blood pressure -previous swelling of the tongue, face, or lips with difficulty breathing, difficulty swallowing, hoarseness, or tightening of the throat -an unusual or allergic reaction to lisinopril, other ACE inhibitors, insect venom, foods, dyes, or preservatives -pregnant or trying to get pregnant -breast-feeding How should I use this medicine? Take this medicine by mouth with a glass of water. Follow the directions on your prescription label. You may take this medicine with or without food. If it upsets your stomach, take it with food. Take your medicine at regular intervals. Do not take it more often than directed. Do not stop taking except on your doctor's advice. Talk to your pediatrician regarding the use of this medicine in children. Special care may be needed. While this drug may be prescribed for children as young as 43 years of age for selected conditions, precautions do apply. Overdosage: If you think you have taken too much of this medicine contact a poison control center or emergency room at once. NOTE: This medicine is only for you. Do not share this medicine with others. What if I miss a dose? If you miss a dose, take it as  soon as you can. If it is almost time for your next dose, take only that dose. Do not take double or extra doses. What may interact with this medicine? Do not take this medicine with any of the following medications: -hymenoptera venom -sacubitril; valsartan This medicines may also interact with the following medications: -aliskiren -angiotensin receptor blockers, like losartan or valsartan -certain medicines for diabetes -diuretics -everolimus -gold compounds -lithium -NSAIDs, medicines for pain and inflammation, like ibuprofen or naproxen -potassium salts or supplements -salt substitutes -sirolimus -temsirolimus This list may not describe all possible interactions. Give your health care provider a list of all the medicines, herbs, non-prescription drugs, or dietary supplements you use. Also tell them if you smoke, drink alcohol, or use illegal drugs. Some items may interact with your medicine. What should I watch for while using this medicine? Visit your doctor or health care professional for regular check ups. Check your blood pressure as directed. Ask your doctor what your blood pressure should be, and when you should contact him or her. Do not treat yourself for coughs, colds, or pain while you are using this medicine without asking your doctor or health care professional for advice. Some ingredients may increase your blood pressure. Women should inform their doctor if they wish to become pregnant or think they might be pregnant. There is a potential for serious side effects to an unborn child. Talk to your health care professional or pharmacist for more information. Check with your doctor or health care professional if you get an attack of severe diarrhea, nausea and  vomiting, or if you sweat a lot. The loss of too much body fluid can make it dangerous for you to take this medicine. You may get drowsy or dizzy. Do not drive, use machinery, or do anything that needs mental alertness  until you know how this drug affects you. Do not stand or sit up quickly, especially if you are an older patient. This reduces the risk of dizzy or fainting spells. Alcohol can make you more drowsy and dizzy. Avoid alcoholic drinks. Avoid salt substitutes unless you are told otherwise by your doctor or health care professional. What side effects may I notice from receiving this medicine? Side effects that you should report to your doctor or health care professional as soon as possible: -allergic reactions like skin rash, itching or hives, swelling of the hands, feet, face, lips, throat, or tongue -breathing problems -signs and symptoms of kidney injury like trouble passing urine or change in the amount of urine -signs and symptoms of increased potassium like muscle weakness; chest pain; or fast, irregular heartbeat -signs and symptoms of liver injury like dark yellow or brown urine; general ill feeling or flu-like symptoms; light-colored stools; loss of appetite; nausea; right upper belly pain; unusually weak or tired; yellowing of the eyes or skin -signs and symptoms of low blood pressure like dizziness; feeling faint or lightheaded, falls; unusually weak or tired -stomach pain with or without nausea and vomiting Side effects that usually do not require medical attention (report to your doctor or health care professional if they continue or are bothersome): -changes in taste -cough -dizziness -fever -headache -sensitivity to light This list may not describe all possible side effects. Call your doctor for medical advice about side effects. You may report side effects to FDA at 1-800-FDA-1088. Where should I keep my medicine? Keep out of the reach of children. Store at room temperature between 15 and 30 degrees C (59 and 86 degrees F). Protect from moisture. Keep container tightly closed. Throw away any unused medicine after the expiration date. NOTE: This sheet is a summary. It may not cover  all possible information. If you have questions about this medicine, talk to your doctor, pharmacist, or health care provider.  2019 Elsevier/Gold Standard (2015-10-18 12:52:35)

## 2018-11-21 ENCOUNTER — Ambulatory Visit: Payer: Self-pay

## 2018-11-29 ENCOUNTER — Telehealth: Payer: Self-pay | Admitting: Family

## 2018-11-29 ENCOUNTER — Telehealth: Payer: Self-pay | Admitting: Family Medicine

## 2018-11-29 DIAGNOSIS — R509 Fever, unspecified: Secondary | ICD-10-CM

## 2018-11-29 DIAGNOSIS — R05 Cough: Secondary | ICD-10-CM

## 2018-11-29 DIAGNOSIS — R059 Cough, unspecified: Secondary | ICD-10-CM

## 2018-11-29 MED ORDER — PROMETHAZINE-DM 6.25-15 MG/5ML PO SYRP: 5.0000 mL | ORAL_SOLUTION | Freq: Four times a day (QID) | ORAL | 0 refills | Status: DC | PRN

## 2018-11-29 NOTE — Telephone Encounter (Signed)
Patient called and left VM that she was having some dry cough, occasional sore throat and not fever. She reports 3 of co-workers have tested positive for flu and they are quarantined at home. Dr. Georgina Snell recommends an e-visit and if her symptoms are not better after doing an e-visit she can come in the office for a further testing. No further questions during the call.

## 2018-11-29 NOTE — Progress Notes (Signed)
[facility] for Corona Virus Screening  Based on your current symptoms, it seems unlikely that your symptoms are related to the Coronavirus.   Coronavirus disease 2019 (COVID-19) is a respiratory illness that can spread from person to person. The virus that causes COVID-19 is a new virus that was first identified in the country of [place] but is now found in multiple other countries and has spread to the [place].  Symptoms associated with the virus are mild to severe fever, cough, and shortness of breath. There is currently no vaccine to protect against COVID-19, and there is no specific antiviral treatment for the virus.   To be considered HIGH RISK for Coronavirus (COVID-19), you have to meet the following criteria:  . Traveled to [place], [place], [place], [place] or [place]; or in the [place] to [place], [place], [place], or [place]; and have fever, cough, and shortness of breath within the last 2 weeks of travel OR  . Been in close contact with a person diagnosed with COVID-19 within the last 2 weeks and have fever, cough, and shortness of breath  . IF YOU DO NOT MEET THESE CRITERIA, YOU ARE CONSIDERED LOW RISK FOR COVID-19.   It is vitally important that if you feel that you have an infection such as this virus or any other virus that you stay home and away from places where you may spread it to others.  You should self-quarantine for 14 days if you have symptoms that could potentially be coronavirus and avoid contact with people age 65 and older.   You can use medication such as A prescription cough medication called Phenergan DM 6.25 mg/15 mg. You make take one teaspoon / 5 ml every 4-6 hours as needed for cough  You may also take acetaminophen (Tylenol) as needed for fever.   Reduce your risk of any infection by using the same precautions used for avoiding the common cold or flu:  Marland Kitchen Wash your hands often with soap and warm water for at least 20 seconds.  If soap and water  are not readily available, use an alcohol-based hand sanitizer with at least 60% alcohol.  . If coughing or sneezing, cover your mouth and nose by coughing or sneezing into the elbow areas of your shirt or coat, into a tissue or into your sleeve (not your hands). . Avoid shaking hands with others and consider head nods or verbal greetings only. . Avoid touching your eyes, nose, or mouth with unwashed hands.  . Avoid close contact with people who are sick. . Avoid places or events with large numbers of people in one location, like concerts or sporting events. . Carefully consider travel plans you have or are making. . If you are planning any travel outside or inside the [place], visit the CDC's Travelers' Health webpage for the latest health notices. . If you have some symptoms but not all symptoms, continue to monitor at home and seek medical attention if your symptoms worsen. . If you are having a medical emergency, call 911.  HOME CARE . Only take medications as instructed by your medical team. . Drink plenty of fluids and get plenty of rest. . A steam or ultrasonic humidifier can help if you have congestion.   GET HELP RIGHT AWAY IF: . You develop worsening fever. . You become short of breath . You cough up blood. . Your symptoms become more severe MAKE SURE YOU   Understand these instructions.  Will watch your condition.  Will get help right away if you are not doing well or get worse.  Your e-visit answers were reviewed by a board certified advanced clinical practitioner to complete your personal care plan.  Depending on the condition, your plan could have included both over the counter or prescription medications.  If there is a problem please reply once you have received a response from your provider. Your safety is important to [place].  If you have drug allergies check your prescription carefully.    You can use MyChart to ask questions about today's visit, request a non-urgent call  back, or ask for a work or school excuse for 24 hours related to this e-Visit. If it has been greater than 24 hours you will need to follow up with your provider, or enter a new e-Visit to address those concerns. You will get an e-mail in the next two days asking about your experience.  I hope that your e-visit has been valuable and will speed your recovery. Thank you for using e-visits.

## 2018-12-10 ENCOUNTER — Encounter: Payer: Self-pay | Admitting: Family Medicine

## 2018-12-10 MED ORDER — ALBUTEROL SULFATE HFA 108 (90 BASE) MCG/ACT IN AERS: 2.0000 | INHALATION_SPRAY | Freq: Four times a day (QID) | RESPIRATORY_TRACT | 0 refills | Status: DC | PRN

## 2019-04-12 ENCOUNTER — Other Ambulatory Visit: Payer: Self-pay | Admitting: Family Medicine

## 2019-04-12 ENCOUNTER — Encounter: Payer: Self-pay | Admitting: Family Medicine

## 2019-04-24 ENCOUNTER — Encounter: Payer: Self-pay | Admitting: Family Medicine

## 2019-04-24 DIAGNOSIS — K649 Unspecified hemorrhoids: Secondary | ICD-10-CM

## 2019-04-25 NOTE — Telephone Encounter (Signed)
I have pended this referral. Please review and sign if OK

## 2019-05-09 ENCOUNTER — Encounter: Payer: Self-pay | Admitting: Gastroenterology

## 2019-05-09 ENCOUNTER — Other Ambulatory Visit: Payer: Self-pay | Admitting: Family Medicine

## 2019-05-11 ENCOUNTER — Telehealth: Payer: Self-pay | Admitting: Nurse Practitioner

## 2019-05-11 DIAGNOSIS — B9789 Other viral agents as the cause of diseases classified elsewhere: Secondary | ICD-10-CM

## 2019-05-11 DIAGNOSIS — J019 Acute sinusitis, unspecified: Secondary | ICD-10-CM

## 2019-05-11 MED ORDER — FLUTICASONE PROPIONATE 50 MCG/ACT NA SUSP: 2.0000 | Freq: Every day | NASAL | 6 refills | Status: DC

## 2019-05-11 MED ORDER — AMOXICILLIN 875 MG PO TABS: 875.0000 mg | ORAL_TABLET | Freq: Two times a day (BID) | ORAL | 0 refills | Status: DC

## 2019-05-11 NOTE — Progress Notes (Signed)
Meds ordered this encounter  Medications  . fluticasone (FLONASE) 50 MCG/ACT nasal spray    Sig: Place 2 sprays into both nostrils daily.    Dispense:  16 g    Refill:  6    Order Specific Question:   Supervising Provider    Answer:   MILLER, BRIAN [[id number]]  . amoxicillin (AMOXIL) 875 MG tablet    Sig: Take 1 tablet (875 mg total) by mouth 2 (two) times daily. 1 po BID    Dispense:  20 tablet    Refill:  0    Order Specific Question:   Supervising Provider    Answer:   Noemi Chapel [[id number]]

## 2019-05-11 NOTE — Addendum Note (Signed)
Addended by: Chevis Pretty on: 05/11/2019 03:21 PM   Modules accepted: Orders

## 2019-05-11 NOTE — Progress Notes (Signed)

## 2019-05-14 ENCOUNTER — Ambulatory Visit: Payer: Self-pay | Admitting: Gastroenterology

## 2019-05-24 ENCOUNTER — Other Ambulatory Visit: Payer: Self-pay | Admitting: Family Medicine

## 2019-05-27 ENCOUNTER — Other Ambulatory Visit: Payer: Self-pay

## 2019-05-27 ENCOUNTER — Ambulatory Visit: Payer: Self-pay | Admitting: Gastroenterology

## 2019-05-27 ENCOUNTER — Encounter: Payer: Self-pay | Admitting: Gastroenterology

## 2019-05-27 VITALS — BP 126/80 | HR 83 | Temp 98.6°F | Ht 63.0 in | Wt 190.0 lb

## 2019-05-27 DIAGNOSIS — Z8371 Family history of colonic polyps: Secondary | ICD-10-CM

## 2019-05-27 DIAGNOSIS — K649 Unspecified hemorrhoids: Secondary | ICD-10-CM

## 2019-05-27 DIAGNOSIS — K921 Melena: Secondary | ICD-10-CM

## 2019-05-27 NOTE — Progress Notes (Signed)
Chief Complaint: Hemorrhoids, hematochezia, family history of colon polyps  Referring Provider:     Gregor Hams, MD   HPI:    Diana Fischer is a 33 y.o. female referred to the [facility] for evaluation of hematochezia and hemorrhoids.   Symptoms have gotten progressively worse with the birth of her first child in 2011 with rectal itching, irritation.  Symptoms worsened with second child in 2015, and youngest child 08/2017.   Symptoms have worsened over the last couple of months, with a flare over the last few weeks.  Has treated with Tucks, Preparation H, and spray. Temporary relief of sxs. More recently developed BRBPR, occurring 3 times over the last couple weeks, with large volume blood.  No change in bowel habits otherwise.  Family history notable for father with precancerous colon polyps.  She reports that despite not being colon cancer, he was told to recommend early colonoscopy/CRC screening to his children.Younger brother with normal colonoscopy recently.  Otherwise, no known family history of CRC, GI malignancy, liver or pancreatic disease.  Labs from 12/2017 notable for: Ferritin 6, iron 30, iron sat 8%, TIBC 371, normal CBC (H/H 13/39.7), normal CMP.  Was treated with oral iron supplement. No repeat study since then, but was told she could stop meds. Also with heavy menses at that time, which has also since stopped/corrected.  No recent abdominal imaging for review.  No previous EGD or colonoscopy.   Past Medical History:  Diagnosis Date  . Anemia   . Arthritis   . 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 Dizzy spells   . Gestational hypertension   . HTN (hypertension)   . Kidney stone   . Pre-eclampsia 11/13/2013  . UTI (urinary tract infection)      Past Surgical History:  Procedure Laterality Date  . NO PAST SURGERIES    . WISDOM TOOTH EXTRACTION     Family History  Problem Relation Age of Onset  .  Hypertension Mother   . Hyperlipidemia Mother   . Hyperlipidemia Father   . Hypertension Father   . Heart attack Maternal Grandfather   . Heart disease Maternal Grandfather   . Cancer Paternal Grandfather        leukemia  . Uterine cancer Maternal Grandmother    Social History   Tobacco Use  . Smoking status: Never Smoker  . Smokeless tobacco: Never Used  Substance Use Topics  . Alcohol use: No  . Drug use: No   Current Outpatient Medications  Medication Sig Dispense Refill  . lisinopril (ZESTRIL) 20 MG tablet Take 1 tablet (20 mg total) by mouth daily. NEEDS APPT 30 tablet 0  . fluticasone (FLONASE) 50 MCG/ACT nasal spray Place 2 sprays into both nostrils daily. (Patient not taking: Reported on 05/27/2019) 16 g 6  . Multiple Vitamins-Minerals (MULTIVITAMIN GUMMIES WOMENS PO) Take 2 capsules by mouth daily.     No current facility-administered medications for this visit.    No Known Allergies   Review of Systems: All systems reviewed and negative except where noted in HPI.     Physical Exam:    Wt Readings from Last 3 Encounters:  05/27/19 190 lb (86.2 kg)  10/23/18 180 lb (81.6 kg)  06/06/17 191 lb 1.9 oz (86.7 kg)    Temp 98.6 F (37 C)   Ht 5\' 3"  (1.6 m)   Wt 190 lb (86.2 kg)  BMI 33.66 kg/m  Constitutional:  Pleasant, in no acute distress. Psychiatric: Normal mood and affect. Behavior is normal. EENT: Pupils normal.  Conjunctivae are normal. No scleral icterus. Neck supple. No cervical LAD. Cardiovascular: Normal rate, regular rhythm. No edema Pulmonary/chest: Effort normal and breath sounds normal. No wheezing, rales or rhonchi. Abdominal: Soft, nondistended, nontender. Bowel sounds active throughout. There are no masses palpable. No hepatomegaly. Neurological: Alert and oriented to person place and time. Skin: Skin is warm and dry. No rashes noted. Rectal exam: Sensation intact and preserved anal wink.  Skin tags noted.  No external anal fissures,  external hemorrhoids. Normal sphincter tone.  Anoscopy with grade 2 hemorrhoid in LL position and grade 1-2 in the RA/RP positions.  No blood on the exam glove. (Chaperone: Curlene Labrum, CMA).    ASSESSMENT AND PLAN;   1) Hematochezia 2) Hemorrhoids  -Anoscopy notable for grade 2 hemorrhoids.  While this is certainly the most likely etiology for her anorectal symptoms and hematochezia, given recent large volume BRBPR, family history, and recommendation from her father's gastroenterologist for early [place] screening, has multiple indications to proceed with colonoscopy -Colonoscopy now -Resume supportive care for symptomatic hemorrhoids -Discussed hemorrhoid band ligation pending colonoscopy, and she is interested in pursuing this for symptomatic relief  3) History of iron deficiency without anemia -Labs in 12/2017 with iron deficiency with otherwise normal H/H.  Was treated with oral iron supplementation.  This was presumed 2/2 heavy menses and possible delayed recovery of iron indices after birth of child 5 months earlier.  Menses have returned to baseline - Repeat iron panel now.  If ongoing iron deficiency, plan for celiac panel, vitamin D level, +/-EGD at time of colonoscopy  The indications, risks, and benefits of colonoscopy were explained to the patient in detail. Risks include but are not limited to bleeding, perforation, adverse reaction to medications, and cardiopulmonary compromise. Sequelae include but are not limited to the possibility of surgery, hospitalization, and mortality. The patient verbalized understanding and wished to proceed. All questions answered, referred to the scheduler and bowel prep ordered. Further recommendations pending results of the exam.     [name], DO, FACG  05/27/2019, 9:01 AM   Gregor Hams, MD

## 2019-05-27 NOTE — Patient Instructions (Signed)
If you are age 34 or older, your body mass index should be between 23-30. Your Body mass index is 33.66 kg/m. If this is out of the aforementioned range listed, please consider follow up with your Primary Care Provider.  If you are age 59 or younger, your body mass index should be between 19-25. Your Body mass index is 33.66 kg/m. If this is out of the aformentioned range listed, please consider follow up with your Primary Care Provider.   To help prevent the possible spread of infection to our patients, communities, and staff; we will be implementing the following measures:  As of now we are not allowing any visitors/family members to accompany you to any upcoming appointments with [facility]. If you have any concerns about this please contact our office to discuss prior to the appointment.   Your provider has requested that you go to the basement level for lab work at our [place] location ([address] [place] [address]) . Press "B" on the elevator. The lab is located at the first door on the left as you exit the elevator. You may go at whatever time is convienent for you. The current hours of operations are Monday- Friday 7:30am-4:30pm.  You have been give a sample of  Plenvu  You have been scheduled for a colonoscopy. Please follow written instructions given to you at your visit today.  Please pick up your prep supplies at the pharmacy within the next 1-3 days. If you use inhalers (even only as needed), please bring them with you on the day of your procedure. Your physician has requested that you go to www.startemmi.com and enter the access code given to you at your visit today. This web site gives a general overview about your procedure. However, you should still follow specific instructions given to you by our office regarding your preparation for the procedure.  It was a pleasure to see you today!  Vito Cirigliano, D.O.

## 2019-06-20 ENCOUNTER — Other Ambulatory Visit: Payer: 59

## 2019-06-20 DIAGNOSIS — K921 Melena: Secondary | ICD-10-CM

## 2019-06-20 DIAGNOSIS — K649 Unspecified hemorrhoids: Secondary | ICD-10-CM

## 2019-06-20 DIAGNOSIS — Z8371 Family history of colonic polyps: Secondary | ICD-10-CM

## 2019-06-21 LAB — IRON,TIBC AND FERRITIN PANEL
%SAT: 16 % (calc) (ref 16–45)
Ferritin: 19 ng/mL (ref 16–154)
Iron: 55 ug/dL (ref 40–190)
TIBC: 341 mcg/dL (calc) (ref 250–450)

## 2019-06-24 ENCOUNTER — Telehealth: Payer: Self-pay

## 2019-06-24 NOTE — Telephone Encounter (Signed)
Covid-19 screening questions   Do you now or have you had a fever in the last 14 days? NO   Do you have any respiratory symptoms of shortness of breath or cough now or in the last 14 days? NO  Do you have any family members or close contacts with diagnosed or suspected Covid-19 in the past 14 days? NO  Have you been tested for Covid-19 and found to be positive? NO        

## 2019-06-25 ENCOUNTER — Other Ambulatory Visit: Payer: Self-pay | Admitting: Gastroenterology

## 2019-06-25 ENCOUNTER — Other Ambulatory Visit: Payer: Self-pay

## 2019-06-25 ENCOUNTER — Encounter: Payer: Self-pay | Admitting: Gastroenterology

## 2019-06-25 ENCOUNTER — Telehealth: Payer: Self-pay

## 2019-06-25 ENCOUNTER — Ambulatory Visit (AMBULATORY_SURGERY_CENTER): Payer: Self-pay | Admitting: Gastroenterology

## 2019-06-25 VITALS — BP 121/65 | HR 69 | Temp 99.4°F | Resp 15 | Ht 63.0 in | Wt 190.0 lb

## 2019-06-25 DIAGNOSIS — K5989 Other specified functional intestinal disorders: Secondary | ICD-10-CM

## 2019-06-25 DIAGNOSIS — K649 Unspecified hemorrhoids: Secondary | ICD-10-CM

## 2019-06-25 DIAGNOSIS — K5730 Diverticulosis of large intestine without perforation or abscess without bleeding: Secondary | ICD-10-CM

## 2019-06-25 DIAGNOSIS — K644 Residual hemorrhoidal skin tags: Secondary | ICD-10-CM

## 2019-06-25 DIAGNOSIS — K921 Melena: Secondary | ICD-10-CM

## 2019-06-25 MED ORDER — SODIUM CHLORIDE 0.9 % IV SOLN: 500.0000 mL | Freq: Once | INTRAVENOUS | Status: DC

## 2019-06-25 NOTE — Telephone Encounter (Signed)
Referral has been faxed to [facility] for patient; awaiting response of appt date/time;

## 2019-06-25 NOTE — Progress Notes (Signed)
Temp check by KA/vital check by CW.  Medical and surgical history reviewed and verified with the patient. 

## 2019-06-25 NOTE — Telephone Encounter (Signed)
-----   Message from [place], DO sent at 06/25/2019  8:59 AM EDT ----- Can you please send referral to Dr. Dema Severin at [facility] for eval and tx of hemorrhoids and bothersome skin tags. Thanks.

## 2019-06-25 NOTE — Patient Instructions (Signed)
YOU HAD AN ENDOSCOPIC PROCEDURE TODAY AT THE [facility]:   Refer to the procedure report that was given to you for any specific questions about what was found during the examination.  If the procedure report does not answer your questions, please call your gastroenterologist to clarify.  If you requested that your care partner not be given the details of your procedure findings, then the procedure report has been included in a sealed envelope for you to review at your convenience later.  YOU SHOULD EXPECT: Some feelings of bloating in the abdomen. Passage of more gas than usual.  Walking can help get rid of the air that was put into your GI tract during the procedure and reduce the bloating. If you had a lower endoscopy (such as a colonoscopy or flexible sigmoidoscopy) you may notice spotting of blood in your stool or on the toilet paper. If you underwent a bowel prep for your procedure, you may not have a normal bowel movement for a few days.  Please Note:  You might notice some irritation and congestion in your nose or some drainage.  This is from the oxygen used during your procedure.  There is no need for concern and it should clear up in a day or so.  SYMPTOMS TO REPORT IMMEDIATELY:   Following lower endoscopy (colonoscopy or flexible sigmoidoscopy):  Excessive amounts of blood in the stool  Significant tenderness or worsening of abdominal pains  Swelling of the abdomen that is new, acute  Fever of 100F or higher  For urgent or emergent issues, a gastroenterologist can be reached at any hour by calling [phone].   DIET:  We do recommend a small meal at first, but then you may proceed to your regular diet.  Drink plenty of fluids but you should avoid alcoholic beverages for 24 hours.  ACTIVITY:  You should plan to take it easy for the rest of today and you should NOT DRIVE or use heavy machinery until tomorrow (because of the sedation medicines used during the test).     FOLLOW UP: Our staff will call the number listed on your records 48-72 hours following your procedure to check on you and address any questions or concerns that you may have regarding the information given to you following your procedure. If we do not reach you, we will leave a message.  We will attempt to reach you two times.  During this call, we will ask if you have developed any symptoms of COVID 19. If you develop any symptoms (ie: fever, flu-like symptoms, shortness of breath, cough etc.) before then, please call [phone].  If you test positive for Covid 19 in the 2 weeks post procedure, please call and report this information to us.    If any biopsies were taken you will be contacted by phone or by letter within the next 1-3 weeks.  Please call us at [phone] if you have not heard about the biopsies in 3 weeks.    SIGNATURES/CONFIDENTIALITY: You and/or your care partner have signed paperwork which will be entered into your electronic medical record.  These signatures attest to the fact that that the information above on your After Visit Summary has been reviewed and is understood.  Full responsibility of the confidentiality of this discharge information lies with you and/or your care-partner. 

## 2019-06-25 NOTE — Op Note (Signed)
[facility] Patient Name: Diana Fischer Procedure Date: 06/25/2019 8:32 AM MRN: [id number] Endoscopist: Gerrit Heck , MD Age: 33 Referring MD:  Date of Birth: Sep 20, 1985 Gender: Female Account #: [id number] Procedure:                Colonoscopy Indications:              Hematochezia, Symptomatic hemorrhoids, Family                            history of colonic polyps in a first-degree relative Medicines:                Monitored Anesthesia Care Procedure:                Pre-Anesthesia Assessment:                           - Prior to the procedure, a History and Physical                            was performed, and patient medications and                            allergies were reviewed. The patient's tolerance of                            previous anesthesia was also reviewed. The risks                            and benefits of the procedure and the sedation                            options and risks were discussed with the patient.                            All questions were answered, and informed consent                            was obtained. Prior Anticoagulants: The patient has                            taken no previous anticoagulant or antiplatelet                            agents. ASA Grade Assessment: II - A patient with                            mild systemic disease. After reviewing the risks                            and benefits, the patient was deemed in                            satisfactory condition to undergo the procedure.  After obtaining informed consent, the colonoscope                            was passed under direct vision. Throughout the                            procedure, the patient's blood pressure, pulse, and                            oxygen saturations were monitored continuously. The                            Colonoscope was introduced through the anus and                            advanced  to the the terminal ileum. The colonoscopy                            was performed without difficulty. The patient                            tolerated the procedure well. The quality of the                            bowel preparation was excellent. The terminal                            ileum, ileocecal valve, appendiceal orifice, and                            rectum were photographed. Scope In: 8:40:39 AM Scope Out: 8:51:35 AM Scope Withdrawal Time: 0 hours 9 minutes 7 seconds  Total Procedure Duration: 0 hours 10 minutes 56 seconds  Findings:                 Skin tags were found on perianal exam.                           A few small-mouthed diverticula were found in the                            sigmoid colon.                           A scattered area of mildly erythematous mucosa was                            found in the sigmoid colon. There were no ulcers or                            erosions and no mucosal edema noted. Biopsies were                            taken with a cold forceps for histology. Estimated  blood loss was minimal.                           Non-bleeding internal hemorrhoids were found during                            retroflexion. The hemorrhoids were small.                           The terminal ileum appeared normal. Complications:            No immediate complications. Estimated Blood Loss:     Estimated blood loss was minimal. Impression:               - Perianal skin tags found on perianal exam.                           - Diverticulosis in the sigmoid colon.                           - Erythematous mucosa in the sigmoid colon.                            Biopsied.                           - Non-bleeding internal hemorrhoids.                           - The examined portion of the ileum was normal. Recommendation:           - Patient has a contact number available for                            emergencies. The signs and  symptoms of potential                            delayed complications were discussed with the                            patient. Return to normal activities tomorrow.                            Written discharge instructions were provided to the                            patient.                           - Resume previous diet.                           - Continue present medications.                           - Await pathology results.                           -  Repeat colonoscopy at age 33 for screening                            purposes.                           - Use fiber, for example Citrucel, Fibercon, Konsyl                            or Metamucil.                           - Internal hemorrhoids and skin tags were noted on                            this study. Given the bothersome nature of the skin                            tags, recommend referral to Colorectal Surgery for                            evaluation and intervention. Gerrit Heck, MD 06/25/2019 8:59:10 AM

## 2019-06-25 NOTE — Progress Notes (Signed)
Called to room to assist during endoscopic procedure.  Patient ID and intended procedure confirmed with present staff. Received instructions for my participation in the procedure from the performing physician.  

## 2019-06-25 NOTE — Progress Notes (Signed)
Report given to PACU, vss 

## 2019-06-26 NOTE — Telephone Encounter (Signed)
Please review previus message

## 2019-06-26 NOTE — Telephone Encounter (Signed)
-----   Message from Mila Merry sent at 06/26/2019  1:21 PM EDT ----- Regarding: RE: referral receipt Hey Yes I spoke with her already She wants to talk with her husband regarding there finances she chose not to schedule at the moment she will call us back  ----- Message ----- From: Mohammed Kindle, RN Sent: 06/26/2019  12:55 PM EDT To: Mila Merry Subject: referral receipt                               Good afternoon,  I just wanted to send a message to check and make sure you received my referral that I faxed yesterday. Please let me know if you have not received it and I will fax it again.  Thank you for your help.Thank you Bre

## 2019-06-27 ENCOUNTER — Telehealth: Payer: Self-pay

## 2019-06-27 NOTE — Telephone Encounter (Signed)
  Follow up Call-  Call back number 06/25/2019  Post procedure Call Back phone  # [phone]  Permission to leave phone message Yes  Some recent data might be hidden     Patient questions:  Do you have a fever, pain , or abdominal swelling? No. Pain Score  0 *  Have you tolerated food without any problems? Yes.    Have you been able to return to your normal activities? Yes.    Do you have any questions about your discharge instructions: Diet   No. Medications  No. Follow up visit  No.  Do you have questions or concerns about your Care? No.  Actions: * If pain score is 4 or above: No action needed, pain <4. 1. Have you developed a fever since your procedure? no  2.   Have you had an respiratory symptoms (SOB or cough) since your procedure? no  3.   Have you tested positive for COVID 19 since your procedure no  4.   Have you had any family members/close contacts diagnosed with the COVID 19 since your procedure?  no   If yes to any of these questions please route to Joylene John, RN and Alphonsa Gin, Therapist, sports.

## 2019-06-27 NOTE — Telephone Encounter (Signed)
Attempted to reach pt. With follow-up call following endoscopic procedure 06/25/2019.  LM on pt. Voice mail.  Will try to reach pt. Again later today.

## 2019-07-03 ENCOUNTER — Encounter: Payer: Self-pay | Admitting: Gastroenterology

## 2019-07-04 ENCOUNTER — Other Ambulatory Visit: Payer: Self-pay | Admitting: Family Medicine

## 2019-07-19 ENCOUNTER — Other Ambulatory Visit: Payer: Self-pay | Admitting: Family Medicine

## 2019-07-23 ENCOUNTER — Telehealth: Payer: Self-pay | Admitting: Nurse Practitioner

## 2019-07-23 DIAGNOSIS — N10 Acute tubulo-interstitial nephritis: Secondary | ICD-10-CM

## 2019-07-23 NOTE — Progress Notes (Signed)
Based on what you shared with me it looks like you have bladder infection or kidney infection ,that should be evaluated in a face to face office visit. You need a urinalyis and urine culture for proper treatment.    NOTE: If you entered your credit card information for this eVisit, you will not be charged. You may see a "hold" on your card for the $35 but that hold will drop off and you will not have a charge processed.  If you are having a true medical emergency please call 911.     For an urgent face to face visit, [facility] has four urgent care centers for your convenience:   . [facility]    [phone]                  Get Driving Directions  [phone] [place], [place] [address] . 10 am to 8 pm Monday-Friday . 12 pm to 8 pm Saturday-Sunday   . [facility] at [place]                  Get Driving Directions  [phone] [place], [place], [place] [address] . 8 am to 8 pm Monday-Friday . 9 am to 6 pm Saturday . 11 am to 6 pm Sunday   . [facility] at [place]                  Get Driving Directions   [address] [place], [place] [address] . 8 am to 8 pm Monday-Friday . 8 am to 4 pm Saturday-Sunday    . [facility] at [place]                    Get Driving Directions  [id number]  [address], [place], [place] [address]  . Monday-Friday, 12 PM to 6 PM    Your e-visit answers were reviewed by a board certified advanced clinical practitioner to complete your personal care plan.  Thank you for using e-Visits.

## 2019-07-31 ENCOUNTER — Other Ambulatory Visit: Payer: Self-pay | Admitting: Physician Assistant

## 2019-08-12 ENCOUNTER — Other Ambulatory Visit: Payer: Self-pay

## 2019-08-12 MED ORDER — LISINOPRIL 20 MG PO TABS: 20.0000 mg | ORAL_TABLET | Freq: Every day | ORAL | 0 refills | Status: DC

## 2019-08-30 IMAGING — US US EXTREM LOW VENOUS*L*
1 series · 13 of 24 positions shown · non-contrast
Comparison: None.

CLINICAL DATA: Left lower extremity for the past 3 weeks. Evaluate
for DVT.



[Series 1: us extrem low venous*left* · 0.08mm/px · 13 of 33 slices shown]
[im 1/33]
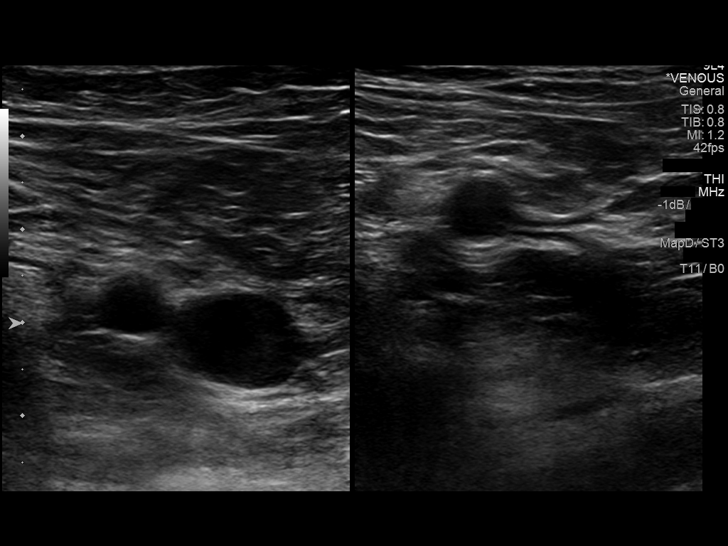
[im 3/33]
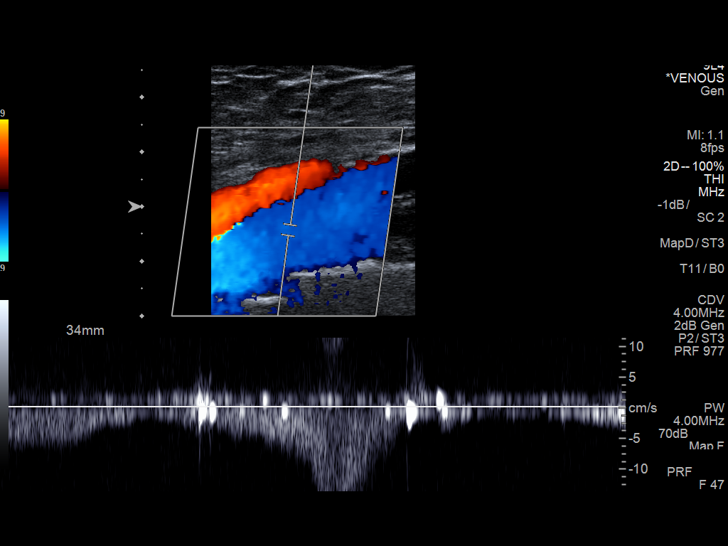
[im 6/33]
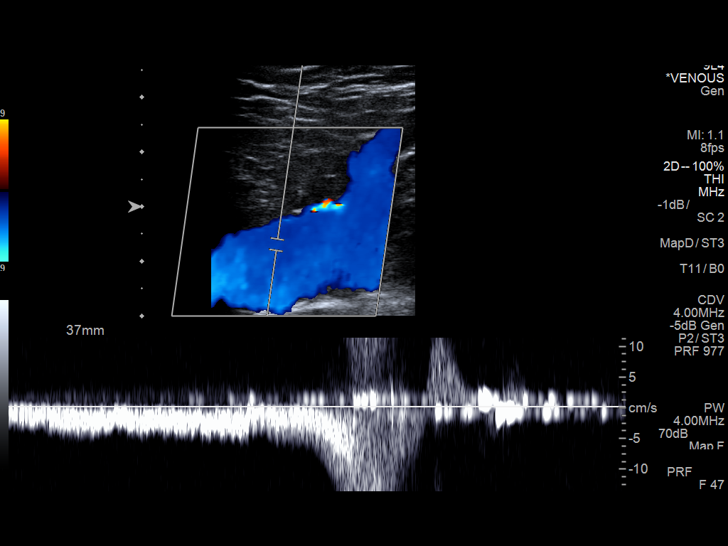
[im 9/33]
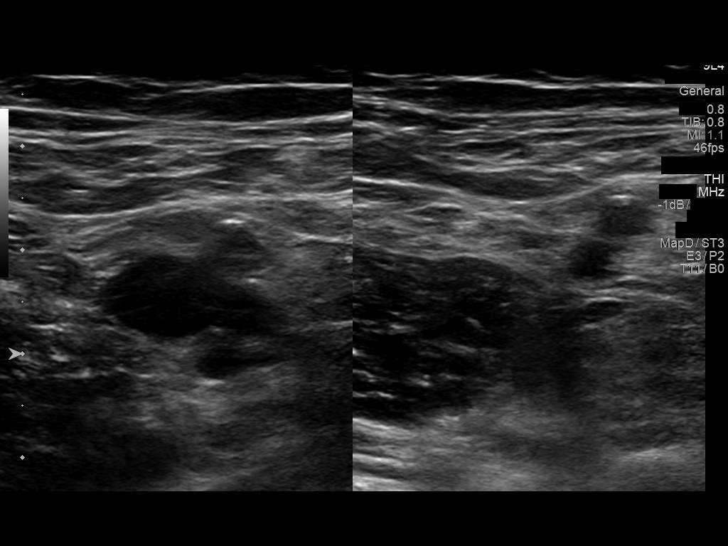
[im 12/33]
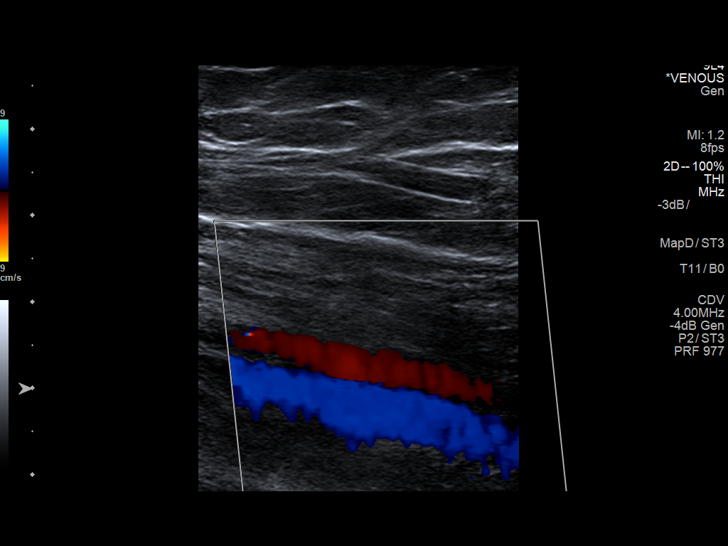
[im 14/33]
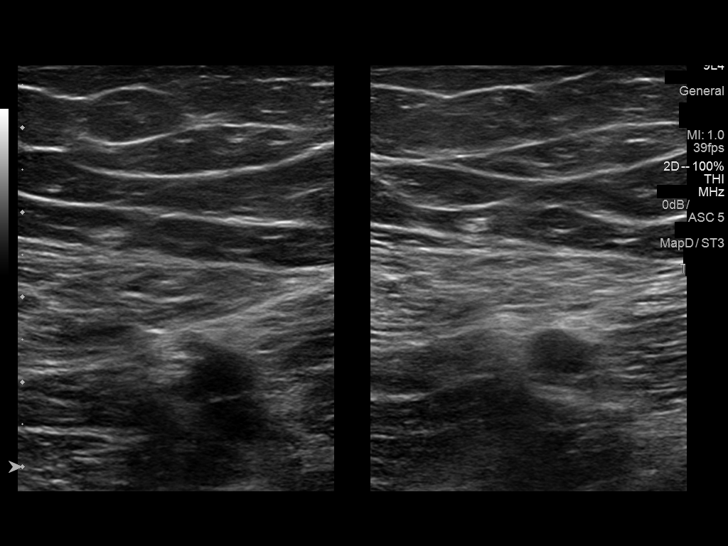
[im 17/33]
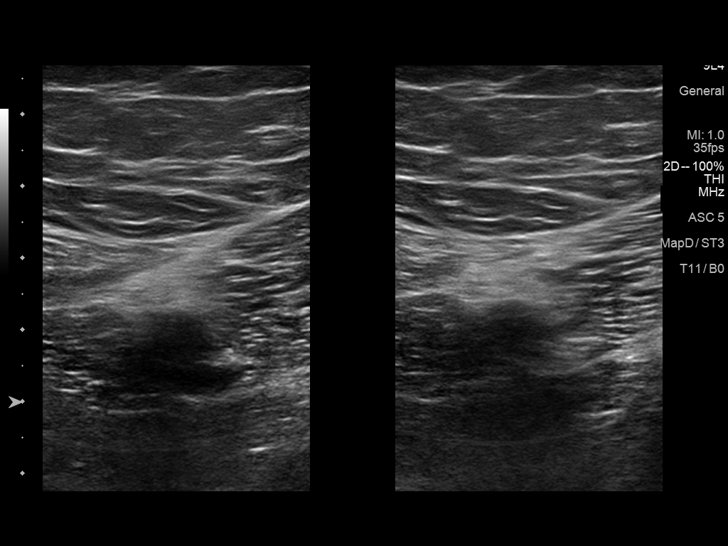
[im 19/33]
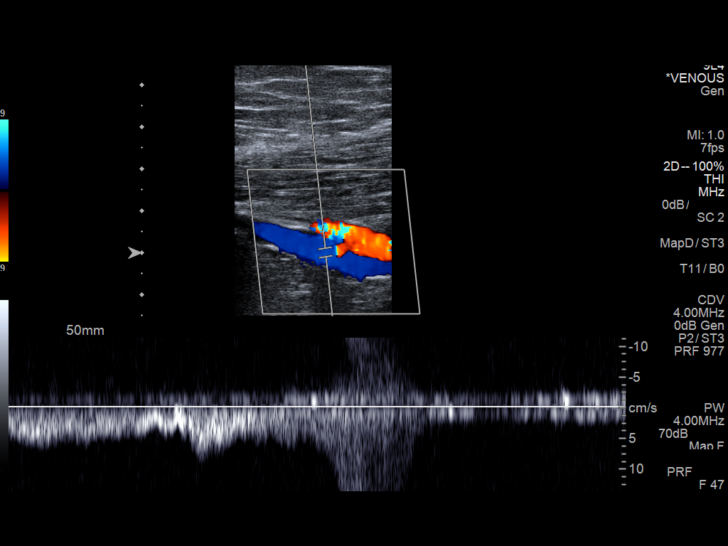
[im 21/33]
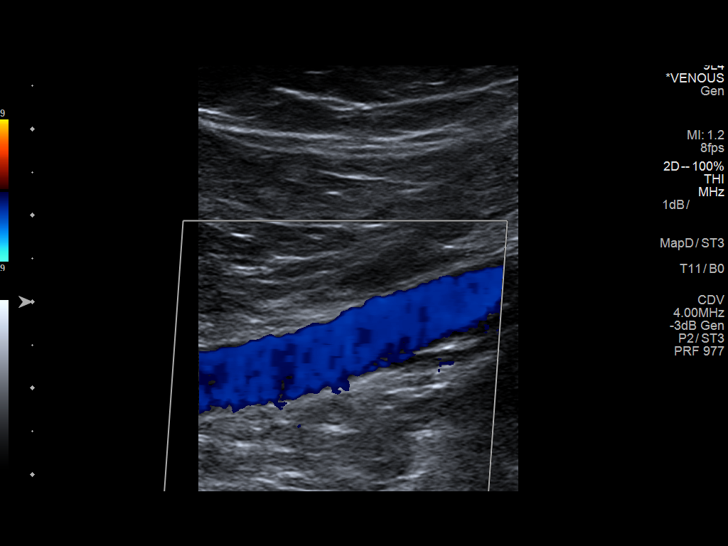
[im 24/33]
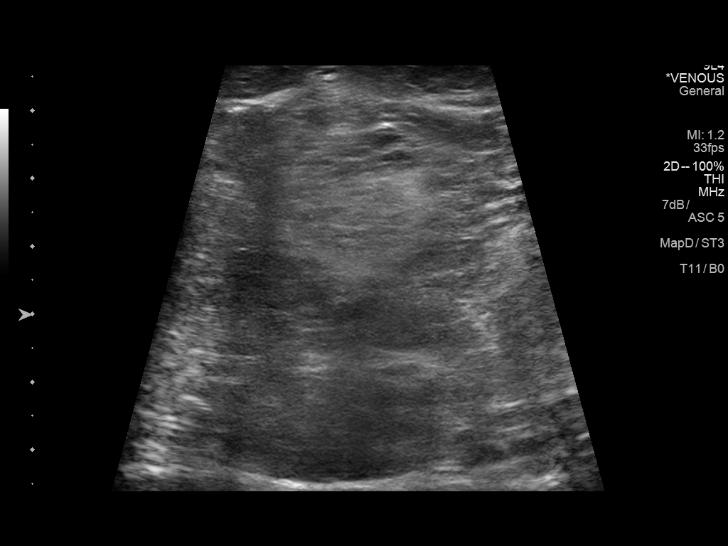
[im 27/33]
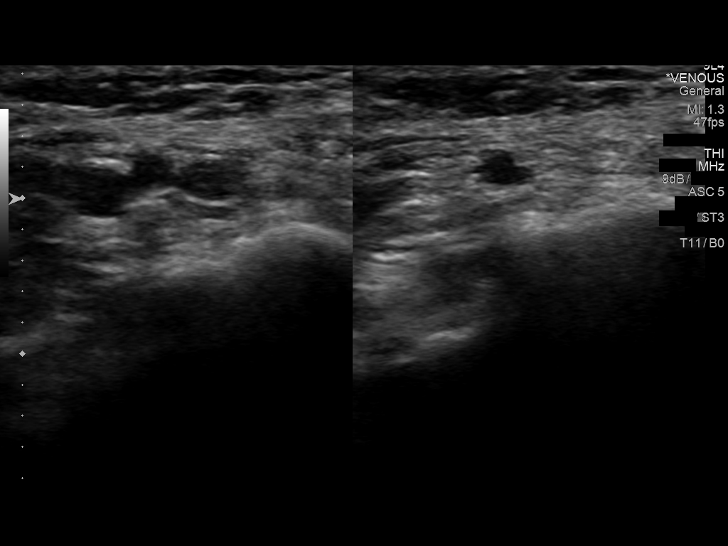
[im 30/33]
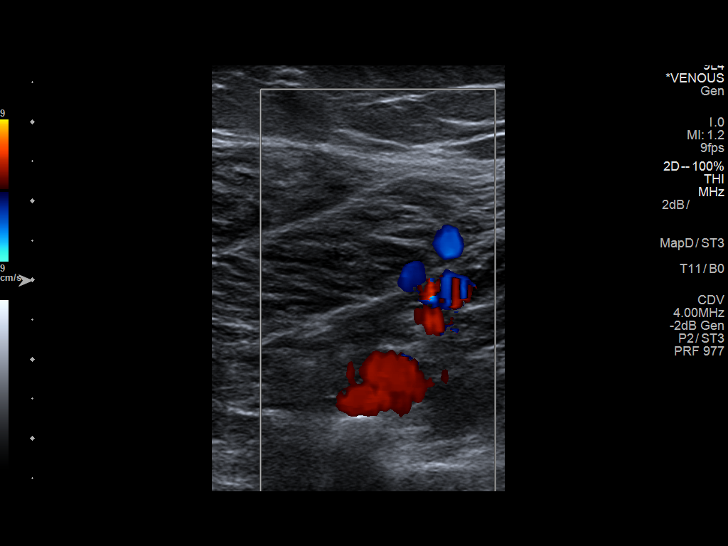
[im 33/33]
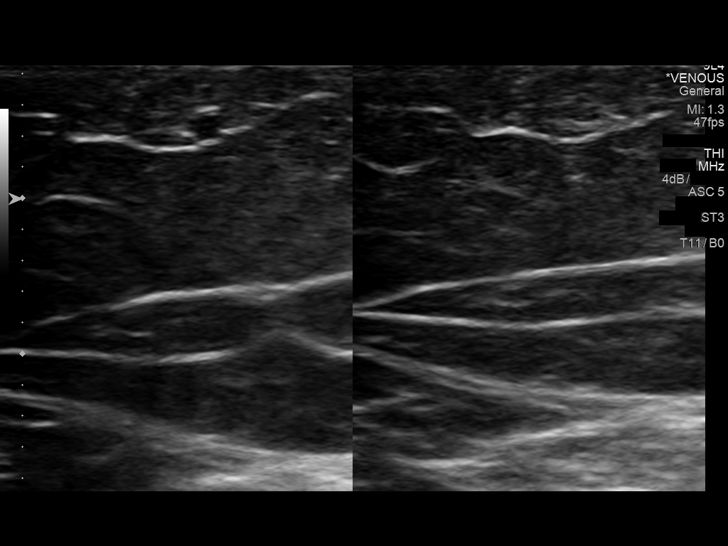

[13 of 24 positions shown; findings below may reference images not displayed]

FINDINGS: Contralateral Common Femoral Vein: Respiratory phasicity is normal
and symmetric with the symptomatic side. No evidence of thrombus.
Normal compressibility.

Common Femoral Vein: No evidence of thrombus. Normal
compressibility, respiratory phasicity and response to augmentation.

Saphenofemoral Junction: No evidence of thrombus. Normal
compressibility and flow on color Doppler imaging.

Profunda Femoral Vein: No evidence of thrombus. Normal
compressibility and flow on color Doppler imaging.

Femoral Vein: No evidence of thrombus. Normal compressibility,
respiratory phasicity and response to augmentation.

Popliteal Vein: No evidence of thrombus. Normal compressibility,
respiratory phasicity and response to augmentation.

Calf Veins: No evidence of thrombus. Normal compressibility and flow
on color Doppler imaging.

Superficial Great Saphenous Vein: No evidence of thrombus. Normal
compressibility.

Venous Reflux:  None.

Other Findings:  None.
IMPRESSION: No evidence of DVT within the left lower extremity.

## 2019-09-06 ENCOUNTER — Telehealth: Payer: Self-pay | Admitting: Nurse Practitioner

## 2019-09-06 DIAGNOSIS — J029 Acute pharyngitis, unspecified: Secondary | ICD-10-CM

## 2019-09-06 DIAGNOSIS — H9201 Otalgia, right ear: Secondary | ICD-10-CM

## 2019-09-06 MED ORDER — AMOXICILLIN 500 MG PO CAPS: 500.0000 mg | ORAL_CAPSULE | Freq: Two times a day (BID) | ORAL | 0 refills | Status: DC

## 2019-09-06 NOTE — Progress Notes (Signed)
We are sorry that you are not feeling well.  Here is how we plan to help!  Based on what you have shared with me it is likely that you have strep pharyngitis along with possible ear infection.  Strep pharyngitis is inflammation and infection in the back of the throat.  This is an infection cause by bacteria and is treated with antibiotics.  I have prescribed Amoxicillin 500 mg twice a day for 10 days. For throat pain, we recommend over the counter oral pain relief medications such as acetaminophen or aspirin, or anti-inflammatory medications such as ibuprofen or naproxen sodium. Topical treatments such as oral throat lozenges or sprays may be used as needed. Strep infections are not as easily transmitted as other respiratory infections, however we still recommend that you avoid close contact with loved ones, especially the very young and elderly.  Remember to wash your hands thoroughly throughout the day as this is the number one way to prevent the spread of infection and wipe down door knobs and counters with disinfectant.   Home Care:  Only take medications as instructed by your medical team.  Complete the entire course of an antibiotic.  Do not take these medications with alcohol.  A steam or ultrasonic humidifier can help congestion.  You can place a towel over your head and breathe in the steam from hot water coming from a faucet.  Avoid close contacts especially the very young and the elderly.  Cover your mouth when you cough or sneeze.  Always remember to wash your hands.  Get Help Right Away If:  You develop worsening fever or sinus pain.  You develop a severe head ache or visual changes.  Your symptoms persist after you have completed your treatment plan.  Make sure you  Understand these instructions.  Will watch your condition.  Will get help right away if you are not doing well or get worse.  Your e-visit answers were reviewed by a board certified advanced clinical  practitioner to complete your personal care plan.  Depending on the condition, your plan could have included both over the counter or prescription medications.  If there is a problem please reply  once you have received a response from your provider.  Your safety is important to [place].  If you have drug allergies check your prescription carefully.    You can use MyChart to ask questions about today's visit, request a non-urgent call back, or ask for a work or school excuse for 24 hours related to this e-Visit. If it has been greater than 24 hours you will need to follow up with your provider, or enter a new e-Visit to address those concerns.  You will get an e-mail in the next two days asking about your experience.  I hope that your e-visit has been valuable and will speed your recovery. Thank you for using e-visits.  5-10 minutes spent reviewing and documenting in chart.

## 2019-09-25 ENCOUNTER — Other Ambulatory Visit: Payer: Self-pay | Admitting: Sports Medicine

## 2019-10-02 ENCOUNTER — Other Ambulatory Visit: Payer: Self-pay

## 2019-10-02 MED ORDER — LISINOPRIL 20 MG PO TABS: 20.0000 mg | ORAL_TABLET | Freq: Every day | ORAL | 0 refills | Status: DC

## 2019-10-27 ENCOUNTER — Other Ambulatory Visit: Payer: Self-pay | Admitting: Neurology

## 2019-10-27 MED ORDER — LISINOPRIL 20 MG PO TABS: 20.0000 mg | ORAL_TABLET | Freq: Every day | ORAL | 0 refills | Status: DC

## 2019-11-15 ENCOUNTER — Other Ambulatory Visit: Payer: Self-pay | Admitting: Physician Assistant

## 2019-11-30 ENCOUNTER — Other Ambulatory Visit: Payer: Self-pay | Admitting: Physician Assistant

## 2019-12-23 ENCOUNTER — Other Ambulatory Visit: Payer: Self-pay | Admitting: Family Medicine

## 2020-01-31 ENCOUNTER — Telehealth: Payer: Self-pay | Admitting: Nurse Practitioner

## 2020-01-31 DIAGNOSIS — J029 Acute pharyngitis, unspecified: Secondary | ICD-10-CM

## 2020-01-31 MED ORDER — AMOXICILLIN 500 MG PO CAPS: 500.0000 mg | ORAL_CAPSULE | Freq: Three times a day (TID) | ORAL | 0 refills | Status: DC

## 2020-01-31 NOTE — Progress Notes (Signed)

## 2020-02-10 ENCOUNTER — Other Ambulatory Visit: Payer: Self-pay

## 2020-02-10 ENCOUNTER — Ambulatory Visit (INDEPENDENT_AMBULATORY_CARE_PROVIDER_SITE_OTHER): Payer: Self-pay | Admitting: Osteopathic Medicine

## 2020-02-10 ENCOUNTER — Encounter: Payer: Self-pay | Admitting: Osteopathic Medicine

## 2020-02-10 VITALS — BP 133/82 | HR 76 | Temp 98.0°F | Wt 187.0 lb

## 2020-02-10 DIAGNOSIS — J039 Acute tonsillitis, unspecified: Secondary | ICD-10-CM

## 2020-02-10 DIAGNOSIS — I10 Essential (primary) hypertension: Secondary | ICD-10-CM

## 2020-02-10 MED ORDER — CLINDAMYCIN HCL 300 MG PO CAPS: 300.0000 mg | ORAL_CAPSULE | Freq: Three times a day (TID) | ORAL | 0 refills | Status: DC

## 2020-02-10 MED ORDER — LISINOPRIL 20 MG PO TABS: 20.0000 mg | ORAL_TABLET | Freq: Every day | ORAL | 1 refills | Status: DC

## 2020-02-10 NOTE — Progress Notes (Signed)
Diana Fischer is a 34 y.o. female who presents to  [facility]  today, 02/10/20, seeking care for the following:  Sore throat - treated for presumed strep via e-visit 01/31/20 (10 days ago) w/ amoxicillin. Today reports abx improved tings after a few days but yesterday felt just as bad. No fever. No trismus or drooling. On exam, tonisils appear normal, (+)ant cerv LN tenderness on R but no enlargement/asymetry, TM normal bilaterally    BP Readings from Last 3 Encounters:  02/10/20 133/82  06/25/19 121/65  05/27/19 126/80     ASSESSMENT & PLAN with other pertinent history/findings:  The primary encounter diagnosis was Tonsillitis. A diagnosis of Essential hypertension was also pertinent to this visit.  Trial clindamycin  If no better, would get CT neck but low suspicion for abscess    Orders Placed This Encounter  Procedures   COMPLETE METABOLIC PANEL WITH GFR   Lipid panel   Ambulatory referral to ENT    Meds ordered this encounter  Medications   clindamycin (CLEOCIN) 300 MG capsule    Sig: Take 1 capsule (300 mg total) by mouth 3 (three) times daily.    Dispense:  21 capsule    Refill:  0   lisinopril (ZESTRIL) 20 MG tablet    Sig: Take 1 tablet (20 mg total) by mouth daily.    Dispense:  90 tablet    Refill:  1       Follow-up instructions: Return in about 6 months (around 08/11/2020) for [place] (get labs prior to visit, orders are in).                                         BP 133/82 (BP Location: Right Arm, Patient Position: Sitting)    Pulse 76    Temp 98 F (36.7 C)    Wt 187 lb (84.8 kg)    SpO2 98%    BMI 33.13 kg/m   Current Meds  Medication Sig   lisinopril (ZESTRIL) 20 MG tablet Take 1 tablet (20 mg total) by mouth daily.   Multiple Vitamins-Minerals (MULTIVITAMIN GUMMIES WOMENS PO) Take 2 capsules by mouth daily.   [DISCONTINUED] amoxicillin  (AMOXIL) 500 MG capsule Take 1 capsule (500 mg total) by mouth 3 (three) times daily.   [DISCONTINUED] lisinopril (ZESTRIL) 20 MG tablet Take 1 tablet (20 mg total) by mouth daily. 2 WEEK SUPPLY GIVEN.PATIENT WILL NEED A FOLLOW UP VISIT WITH LABS FOR REFILLS    No results found for this or any previous visit (from the past 39 hour(s)).  No results found.  No flowsheet data found.  No flowsheet data found.    All questions at time of visit were answered - patient instructed to contact office with any additional concerns or updates.  ER/RTC precautions were reviewed with the patient.  Please note: voice recognition software was used to produce this document, and typos may escape review. Please contact Dr. Sheppard Coil for any needed clarifications.

## 2020-02-27 ENCOUNTER — Ambulatory Visit (INDEPENDENT_AMBULATORY_CARE_PROVIDER_SITE_OTHER): Payer: Self-pay | Admitting: Otolaryngology

## 2020-05-18 ENCOUNTER — Telehealth: Payer: Self-pay | Admitting: Physician Assistant

## 2020-05-18 DIAGNOSIS — J329 Chronic sinusitis, unspecified: Secondary | ICD-10-CM

## 2020-05-18 MED ORDER — AMOXICILLIN-POT CLAVULANATE 875-125 MG PO TABS
1.0000 | ORAL_TABLET | Freq: Two times a day (BID) | ORAL | 0 refills | Status: DC
Start: 2020-05-18 — End: 2020-12-24

## 2020-05-18 NOTE — Progress Notes (Signed)

## 2020-05-31 NOTE — Progress Notes (Signed)
6 minutes spent  charting

## 2020-09-08 ENCOUNTER — Encounter: Payer: Self-pay | Admitting: Osteopathic Medicine

## 2020-10-26 ENCOUNTER — Other Ambulatory Visit: Payer: Self-pay | Admitting: Osteopathic Medicine

## 2020-11-19 ENCOUNTER — Other Ambulatory Visit: Payer: Self-pay | Admitting: Osteopathic Medicine

## 2020-12-18 ENCOUNTER — Encounter: Payer: Self-pay | Admitting: Physician Assistant

## 2020-12-18 ENCOUNTER — Telehealth: Payer: Self-pay | Admitting: Physician Assistant

## 2020-12-18 DIAGNOSIS — J018 Other acute sinusitis: Secondary | ICD-10-CM

## 2020-12-18 MED ORDER — AMOXICILLIN-POT CLAVULANATE 875-125 MG PO TABS
1.0000 | ORAL_TABLET | Freq: Two times a day (BID) | ORAL | 0 refills | Status: DC
Start: 2020-12-18 — End: 2020-12-31

## 2020-12-18 NOTE — Progress Notes (Signed)
We are sorry that you are not feeling well.  Here is how we plan to help!  Based on what you have shared with me it looks like you have sinusitis.  Sinusitis is inflammation and infection in the sinus cavities of the head.  Based on your presentation I believe you most likely have Acute Bacterial Sinusitis.  This is an infection caused by bacteria and is treated with antibiotics. I have prescribed Augmentin 875mg /125mg  one tablet twice daily with food, for 7 days. You may use an oral decongestant such as Mucinex D or if you have glaucoma or high blood pressure use plain Mucinex. Saline nasal spray help and can safely be used as often as needed for congestion.  If you develop worsening sinus pain, fever or notice severe headache and vision changes, or if symptoms are not better after completion of antibiotic, please schedule an appointment with a health care provider.    Sinus infections are not as easily transmitted as other respiratory infection, however we still recommend that you avoid close contact with loved ones, especially the very young and elderly.  Remember to wash your hands thoroughly throughout the day as this is the number one way to prevent the spread of infection!  Home Care:  Only take medications as instructed by your medical team.  Complete the entire course of an antibiotic.  Do not take these medications with alcohol.  A steam or ultrasonic humidifier can help congestion.  You can place a towel over your head and breathe in the steam from hot water coming from a faucet.  Avoid close contacts especially the very young and the elderly.  Cover your mouth when you cough or sneeze.  Always remember to wash your hands.  Get Help Right Away If:  You develop worsening fever or sinus pain.  You develop a severe head ache or visual changes.  Your symptoms persist after you have completed your treatment plan.  Make sure you  Understand these instructions.  Will watch your  condition.  Will get help right away if you are not doing well or get worse.  Your e-visit answers were reviewed by a board certified advanced clinical practitioner to complete your personal care plan.  Depending on the condition, your plan could have included both over the counter or prescription medications.  If there is a problem please reply  once you have received a response from your provider.  Your safety is important to [place].  If you have drug allergies check your prescription carefully.    You can use MyChart to ask questions about today's visit, request a non-urgent call back, or ask for a work or school excuse for 24 hours related to this e-Visit. If it has been greater than 24 hours you will need to follow up with your provider, or enter a new e-Visit to address those concerns.  You will get an e-mail in the next two days asking about your experience.  I hope that your e-visit has been valuable and will speed your recovery. Thank you for using e-visits.   I spent 5-10 minutes on review and completion of this note- L[facility]

## 2020-12-24 ENCOUNTER — Telehealth: Payer: Self-pay | Admitting: Physician Assistant

## 2020-12-24 DIAGNOSIS — R3989 Other symptoms and signs involving the genitourinary system: Secondary | ICD-10-CM

## 2020-12-24 MED ORDER — SULFAMETHOXAZOLE-TRIMETHOPRIM 800-160 MG PO TABS: 1.0000 | ORAL_TABLET | Freq: Two times a day (BID) | ORAL | 0 refills | Status: DC

## 2020-12-24 NOTE — Progress Notes (Signed)
We are sorry that you are not feeling well.  Here is how we plan to help!  Based on what you shared with me it looks like you most likely have a simple urinary tract infection.  A UTI (Urinary Tract Infection) is a bacterial infection of the bladder.  Most cases of urinary tract infections are simple to treat but a key part of your care is to encourage you to drink plenty of fluids and watch your symptoms carefully.  I have prescribed Bactrim DS One tablet twice a day for 5 days.  Your symptoms should gradually improve. Call us if the burning in your urine worsens, you develop worsening fever, back pain or pelvic pain or if your symptoms do not resolve after completing the antibiotic.  I see you are currently on Augmentin for a sinus infection, but should be completing soon. There can be resistance to Augmentin when it comes to treating UTIs and why you may have developed symptoms despite being on an antibiotic. It is possible you could have something else going on as well. If symptoms do not improve with the Bactrim, please seek in-person evaluation.  Urinary tract infections can be prevented by drinking plenty of water to keep your body hydrated.  Also be sure when you wipe, wipe from front to back and don't hold it in!  If possible, empty your bladder every 4 hours.  Your e-visit answers were reviewed by a board certified advanced clinical practitioner to complete your personal care plan.  Depending on the condition, your plan could have included both over the counter or prescription medications.  If there is a problem please reply  once you have received a response from your provider.  Your safety is important to [place].  If you have drug allergies check your prescription carefully.    You can use MyChart to ask questions about today's visit, request a non-urgent call back, or ask for a work or school excuse for 24 hours related to this e-Visit. If it has been greater than 24 hours you will need  to follow up with your provider, or enter a new e-Visit to address those concerns.   You will get an e-mail in the next two days asking about your experience.  I hope that your e-visit has been valuable and will speed your recovery. Thank you for using e-visits.  I provided 7 minutes of non face-to-face time during this encounter for chart review and documentation.

## 2020-12-30 ENCOUNTER — Emergency Department
Admission: RE | Admit: 2020-12-30 | Discharge: 2020-12-30 | Disposition: A | Discharge status: Home / Self Care | Payer: Self-pay | Source: Ambulatory Visit

## 2020-12-30 ENCOUNTER — Encounter: Payer: Self-pay | Admitting: Osteopathic Medicine

## 2020-12-30 ENCOUNTER — Other Ambulatory Visit: Payer: Self-pay

## 2020-12-30 VITALS — BP 127/87 | HR 95 | Temp 99.6°F | Resp 16

## 2020-12-30 DIAGNOSIS — Z87440 Personal history of urinary (tract) infections: Secondary | ICD-10-CM

## 2020-12-30 DIAGNOSIS — M545 Low back pain, unspecified: Secondary | ICD-10-CM

## 2020-12-30 DIAGNOSIS — R300 Dysuria: Secondary | ICD-10-CM

## 2020-12-30 DIAGNOSIS — R350 Frequency of micturition: Secondary | ICD-10-CM

## 2020-12-30 LAB — POCT URINALYSIS DIP (MANUAL ENTRY)
Bilirubin, UA: NEGATIVE
Blood, UA: NEGATIVE
Glucose, UA: NEGATIVE mg/dL
Ketones, POC UA: NEGATIVE mg/dL
Leukocytes, UA: NEGATIVE
Nitrite, UA: NEGATIVE
Protein Ur, POC: NEGATIVE mg/dL
Spec Grav, UA: 1.02 (ref 1.010–1.025)
Urobilinogen, UA: 0.2 E.U./dL
pH, UA: 7 (ref 5.0–8.0)

## 2020-12-30 MED ORDER — URIBEL 118 MG PO CAPS: 1.0000 | ORAL_CAPSULE | Freq: Four times a day (QID) | ORAL | 0 refills | Status: DC | PRN

## 2020-12-30 MED ORDER — KETOROLAC TROMETHAMINE 30 MG/ML IJ SOLN
30.0000 mg | Freq: Once | INTRAMUSCULAR | Status: AC
  Administered 2020-12-30: 30 mg via INTRAMUSCULAR

## 2020-12-30 MED ORDER — CYCLOBENZAPRINE HCL 10 MG PO TABS: 10.0000 mg | ORAL_TABLET | Freq: Two times a day (BID) | ORAL | 0 refills | Status: DC | PRN

## 2020-12-30 NOTE — ED Provider Notes (Signed)
[facility]   CC: UTI  SUBJECTIVE:  Diana Fischer is a 35 y.o. female who complains of urinary frequency, urgency and dysuria for the past week. Patient denies a precipitating event, recent sexual encounter, excessive caffeine intake. Localizes the pain to the lower abdomen and low back.  Pain is intermittent and describes it as sharp and burning. Was diagnosed with UTI via video visit and treated with Bactrim. States that she finished this yesterday. Reports hx UTI, kidney stones, and ovarian cyst. Symptoms are made worse with urination. Admits to similar symptoms in the past. Denies fever, chills, nausea, vomiting, flank pain, abnormal vaginal discharge or bleeding, hematuria.    LMP: Patient's last menstrual period was 12/16/2020 (approximate).  ROS: As in HPI.  All other pertinent ROS negative.     Past Medical History:  Diagnosis Date  . Anemia   . Arthritis   . 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 Dizzy spells    during pregnancy  . Gestational hypertension   . Hemorrhoids   . HTN (hypertension)   . Kidney stone   . Pre-eclampsia 11/13/2013  . UTI (urinary tract infection)    Past Surgical History:  Procedure Laterality Date  . NO PAST SURGERIES    . WISDOM TOOTH EXTRACTION     No Known Allergies No current facility-administered medications on file prior to encounter.   Current Outpatient Medications on File Prior to Encounter  Medication Sig Dispense Refill  . lisinopril (ZESTRIL) 20 MG tablet TAKE 1 TABLET BY MOUTH EVERY DAY 30 tablet 0  . amoxicillin-clavulanate (AUGMENTIN) 875-125 MG tablet Take 1 tablet by mouth 2 (two) times daily. (Patient not taking: Reported on 12/30/2020) 14 tablet 0  . fluticasone (FLONASE) 50 MCG/ACT nasal spray Place 2 sprays into both nostrils daily. (Patient not taking: Reported on 05/27/2019) 16 g 6  . Multiple Vitamins-Minerals (MULTIVITAMIN GUMMIES WOMENS PO) Take 2 capsules by mouth daily.     Marland Kitchen sulfamethoxazole-trimethoprim (BACTRIM DS) 800-160 MG tablet Take 1 tablet by mouth 2 (two) times daily. (Patient not taking: Reported on 12/30/2020) 10 tablet 0   Social History   Socioeconomic History  . Marital status: Married    Spouse name: Not on file  . Number of children: 3  . Years of education: Not on file  . Highest education level: Not on file  Occupational History  . Not on file  Tobacco Use  . Smoking status: Never Smoker  . Smokeless tobacco: Never Used  Vaping Use  . Vaping Use: Never used  Substance and Sexual Activity  . Alcohol use: No  . Drug use: No  . Sexual activity: Yes    Birth control/protection: None    Comment: spouse - vasectomy  Other Topics Concern  . Not on file  Social History Narrative  . Not on file   Social Determinants of Health   Financial Resource Strain: Not on file  Food Insecurity: Not on file  Transportation Needs: Not on file  Physical Activity: Not on file  Stress: Not on file  Social Connections: Not on file  Intimate Partner Violence: Not on file   Family History  Problem Relation Age of Onset  . Hypertension Mother   . Hyperlipidemia Mother   . Hyperlipidemia Father   . Hypertension Father   . Colon polyps Father   . Irritable bowel syndrome Father   . Heart attack Maternal Grandfather   . Heart disease Maternal Grandfather   .  Cancer Paternal Grandfather        leukemia  . Uterine cancer Maternal Grandmother   . Colon cancer Neg Hx   . Esophageal cancer Neg Hx   . Stomach cancer Neg Hx   . Rectal cancer Neg Hx     OBJECTIVE:  Vitals:   12/30/20 0843  BP: 127/87  Pulse: 95  Resp: 16  Temp: 99.6 F (37.6 C)  TempSrc: Oral  SpO2: 96%   General appearance: AOx3 in no acute distress HEENT: NCAT. Oropharynx clear.  Lungs: clear to auscultation bilaterally without adventitious breath sounds Heart: regular rate and rhythm. Radial pulses 2+ symmetrical bilaterally Abdomen: soft; non-distended;  suprapubic tenderness with light palpation; bowel sounds present; no guarding or rebound tenderness Back: no CVA tenderness Extremities: no edema; symmetrical with no gross deformities Skin: warm and dry Neurologic: Ambulates from chair to exam table without difficulty Psychological: alert and cooperative; normal mood and affect  Labs Reviewed  POCT URINALYSIS DIP (MANUAL ENTRY) - Abnormal; Notable for the following components:      Result Value   Clarity, UA cloudy (*)    All other components within normal limits  URINE CULTURE    ASSESSMENT & PLAN:  1. Dysuria   2. Urinary frequency   3. Acute bilateral low back pain without sciatica   4. History of recurrent UTIs     Meds ordered this encounter  Medications  . Meth-Hyo-M Bl-Na Phos-Ph Sal (URIBEL) 118 MG CAPS    Sig: Take 1 capsule (118 mg total) by mouth every 6 (six) hours as needed (painful urination).    Dispense:  30 capsule    Refill:  0    Order Specific Question:   Supervising Provider    Answer:   Chase Picket [id number]  . cyclobenzaprine (FLEXERIL) 10 MG tablet    Sig: Take 1 tablet (10 mg total) by mouth 2 (two) times daily as needed for muscle spasms.    Dispense:  20 tablet    Refill:  0    Order Specific Question:   Supervising Provider    Answer:   Chase Picket [id number]  . ketorolac (TORADOL) 30 MG/ML injection 30 mg   Toradol 30mg  IM in office today Suspect bladder/pelvic floor issue is the etiology of her pain UA unremarkable No evidence of rena calculi Prescribed uribel Prescribed cyclobenzaprine Urine culture sent  We will call you with abnormal results that need further treatment Push fluids and get plenty of rest Follow up with urology if symptoms persists Return here or go to ER if you have any new or worsening symptoms such as fever, worsening abdominal pain, nausea/vomiting, flank pain  Outlined signs and symptoms indicating need for more acute intervention Patient verbalized  understanding After Visit Summary given     Faustino Congress, NP 12/30/20 [phone]

## 2020-12-30 NOTE — Discharge Instructions (Addendum)
You have received a toradol injection in the office today for pain. Do not take ibuprofen for the next 6 hours. You may take tylenol with this medication.  We will culture your urine and inform of abnormal results that require further treatment.  I have sent in uribel for you to take one capsule every 6 hours as needed for painful urination  I have sent in flexeril for you to take twice a day as needed for muscle spasms. This medication can make you sleepy. Do not drive or operate heavy machinery with this medication.  Follow up with this office or with primary care if symptoms are persisting.  Follow up in the ER for high fever, trouble swallowing, trouble breathing, other concerning symptoms.

## 2020-12-30 NOTE — ED Triage Notes (Addendum)
Dx w/ a UTI via an e-visit last Friday- finished Bactrim yesterday Urinary frequency w/ urgency & nausea R & L flank pain  HX of ovarian cyst & kidney stone  In 2015 Describes pain as stabbing Ibuprofen yesterday

## 2020-12-31 ENCOUNTER — Ambulatory Visit (INDEPENDENT_AMBULATORY_CARE_PROVIDER_SITE_OTHER): Payer: Self-pay | Admitting: Sports Medicine

## 2020-12-31 ENCOUNTER — Other Ambulatory Visit: Payer: Self-pay

## 2020-12-31 DIAGNOSIS — R1032 Left lower quadrant pain: Secondary | ICD-10-CM

## 2020-12-31 LAB — POCT URINALYSIS DIP (CLINITEK)
Bilirubin, UA: NEGATIVE
Blood, UA: NEGATIVE
Glucose, UA: NEGATIVE mg/dL
Ketones, POC UA: NEGATIVE mg/dL
Nitrite, UA: NEGATIVE
POC PROTEIN,UA: NEGATIVE
Spec Grav, UA: 1.015 (ref 1.010–1.025)
Urobilinogen, UA: 0.2 E.U./dL
pH, UA: 8.5 — AB (ref 5.0–8.0)

## 2020-12-31 LAB — URINE CULTURE
MICRO NUMBER:: 11797596
Result:: NO GROWTH
SPECIMEN QUALITY:: ADEQUATE

## 2020-12-31 MED ORDER — NITROFURANTOIN MONOHYD MACRO 100 MG PO CAPS: 100.0000 mg | ORAL_CAPSULE | Freq: Two times a day (BID) | ORAL | 0 refills | Status: DC

## 2020-12-31 MED ORDER — PHENAZOPYRIDINE HCL 200 MG PO TABS: 200.0000 mg | ORAL_TABLET | Freq: Three times a day (TID) | ORAL | 0 refills | Status: AC

## 2020-12-31 NOTE — Progress Notes (Signed)
    Procedures performed today:    None.  Independent interpretation of notes and tests performed by another provider:   None.  Brief History, Exam, Impression, and Recommendations:    Acute abdominal pain in left lower quadrant This is a pleasant 35 year old female with a week and a half of acute abdominal pain, suprapubic and left lower quadrant, occasional pain in the left flank with urinary symptoms including hesitancy, urgency, may be some mild burning. No fevers, chills, she does have a bit of bright red blood per rectum but has a history of hemorrhoids. No nausea, vomiting. On exam she has some pain in the suprapubic region as well as the left lower quadrant, no left or right costovertebral angle pain with palpation or percussion. Her urinalysis today was positive for leukocyte esterase, this will be sent for culture. She was started on Bactrim, we will discontinue this, it has a 70-ish percent success rate with E. coli, Macrobid is around 97% and concentrates well into the urinary tract so adding Macrobid for 7 days, Pyridium. Due to her flank and mid abdominal pain as well we will go ahead and get a CT abdomen and pelvis with oral and IV contrast, nonstat, I would also like CBC, CMP, amylase, lipase.    ___________________________________________ Gwen Her. Dianah Field, M.D., ABFM., CAQSM. Primary Care and [place] In[employer] of [employer] [facility]

## 2020-12-31 NOTE — Assessment & Plan Note (Signed)
This is a pleasant 35 year old female with a week and a half of acute abdominal pain, suprapubic and left lower quadrant, occasional pain in the left flank with urinary symptoms including hesitancy, urgency, may be some mild burning. No fevers, chills, she does have a bit of bright red blood per rectum but has a history of hemorrhoids. No nausea, vomiting. On exam she has some pain in the suprapubic region as well as the left lower quadrant, no left or right costovertebral angle pain with palpation or percussion. Her urinalysis today was positive for leukocyte esterase, this will be sent for culture. She was started on Bactrim, we will discontinue this, it has a 70-ish percent success rate with E. coli, Macrobid is around 97% and concentrates well into the urinary tract so adding Macrobid for 7 days, Pyridium. Due to her flank and mid abdominal pain as well we will go ahead and get a CT abdomen and pelvis with oral and IV contrast, nonstat, I would also like CBC, CMP, amylase, lipase.

## 2021-01-01 LAB — CBC WITH DIFFERENTIAL/PLATELET
Absolute Monocytes: 670 cells/uL (ref 200–950)
Basophils Absolute: 50 cells/uL (ref 0–200)
Basophils Relative: 0.5 %
Eosinophils Absolute: 120 cells/uL (ref 15–500)
Eosinophils Relative: 1.2 %
HCT: 40.9 % (ref 35.0–45.0)
Hemoglobin: 13.6 g/dL (ref 11.7–15.5)
Lymphs Abs: 2600 cells/uL (ref 850–3900)
MCH: 30.6 pg (ref 27.0–33.0)
MCHC: 33.3 g/dL (ref 32.0–36.0)
MCV: 91.9 fL (ref 80.0–100.0)
MPV: 9.3 fL (ref 7.5–12.5)
Monocytes Relative: 6.7 %
Neutro Abs: 6560 cells/uL (ref 1500–7800)
Neutrophils Relative %: 65.6 %
Platelets: 329 10*3/uL (ref 140–400)
RBC: 4.45 10*6/uL (ref 3.80–5.10)
RDW: 11.5 % (ref 11.0–15.0)
Total Lymphocyte: 26 %
WBC: 10 10*3/uL (ref 3.8–10.8)

## 2021-01-01 LAB — COMPREHENSIVE METABOLIC PANEL
AG Ratio: 1.6 (calc) (ref 1.0–2.5)
ALT: 18 U/L (ref 6–29)
AST: 16 U/L (ref 10–30)
Albumin: 4.4 g/dL (ref 3.6–5.1)
Alkaline phosphatase (APISO): 84 U/L (ref 31–125)
BUN: 12 mg/dL (ref 7–25)
CO2: 29 mmol/L (ref 20–32)
Calcium: 9.8 mg/dL (ref 8.6–10.2)
Chloride: 103 mmol/L (ref 98–110)
Creat: 0.83 mg/dL (ref 0.50–1.10)
Globulin: 2.7 g/dL (calc) (ref 1.9–3.7)
Glucose, Bld: 84 mg/dL (ref 65–139)
Potassium: 4.3 mmol/L (ref 3.5–5.3)
Sodium: 138 mmol/L (ref 135–146)
Total Bilirubin: 0.4 mg/dL (ref 0.2–1.2)
Total Protein: 7.1 g/dL (ref 6.1–8.1)

## 2021-01-01 LAB — LIPID PANEL
Cholesterol: 194 mg/dL (ref <200)
HDL: 50 mg/dL (ref >=50)
LDL Cholesterol (Calc): 121 mg/dL (calc) — ABNORMAL HIGH
Non-HDL Cholesterol (Calc): 144 mg/dL (calc) — ABNORMAL HIGH (ref <130)
Total CHOL/HDL Ratio: 3.9 (calc) (ref <5.0)
Triglycerides: 115 mg/dL (ref <150)

## 2021-01-01 LAB — AMYLASE: Amylase: 59 U/L (ref 21–101)

## 2021-01-02 LAB — URINE CULTURE
MICRO NUMBER:: 11805332
SPECIMEN QUALITY:: ADEQUATE

## 2021-01-14 ENCOUNTER — Ambulatory Visit: Payer: Self-pay | Admitting: Sports Medicine

## 2021-06-04 ENCOUNTER — Telehealth: Payer: Self-pay | Admitting: Nurse Practitioner

## 2021-06-04 DIAGNOSIS — J0110 Acute frontal sinusitis, unspecified: Secondary | ICD-10-CM

## 2021-06-04 MED ORDER — AMOXICILLIN-POT CLAVULANATE 875-125 MG PO TABS: 1.0000 | ORAL_TABLET | Freq: Two times a day (BID) | ORAL | 0 refills | Status: AC

## 2021-06-04 NOTE — Progress Notes (Signed)
E-Visit for Sinus Problems  We are sorry that you are not feeling well.  Here is how we plan to help!  Based on what you have shared with me it looks like you have sinusitis.  Sinusitis is inflammation and infection in the sinus cavities of the head.  Based on your presentation I believe you most likely have Acute Bacterial Sinusitis.  This is an infection caused by bacteria and is treated with antibiotics. I have prescribed Augmentin 875mg /125mg  one tablet twice daily with food, for 7 days. You may use an oral decongestant such as Mucinex D or if you have glaucoma or high blood pressure use plain Mucinex. Saline nasal spray help and can safely be used as often as needed for congestion.  If you develop worsening sinus pain, fever or notice severe headache and vision changes, or if symptoms are not better after completion of antibiotic, please schedule an appointment with a health care provider.    Sinus infections are not as easily transmitted as other respiratory infection, however we still recommend that you avoid close contact with loved ones, especially the very young and elderly.  Remember to wash your hands thoroughly throughout the day as this is the number one way to prevent the spread of infection!  Home Care: Only take medications as instructed by your medical team. Complete the entire course of an antibiotic. Do not take these medications with alcohol. A steam or ultrasonic humidifier can help congestion.  You can place a towel over your head and breathe in the steam from hot water coming from a faucet. Avoid close contacts especially the very young and the elderly. Cover your mouth when you cough or sneeze. Always remember to wash your hands.  Get Help Right Away If: You develop worsening fever or sinus pain. You develop a severe head ache or visual changes. Your symptoms persist after you have completed your treatment plan.  Make sure you Understand these instructions. Will watch  your condition. Will get help right away if you are not doing well or get worse.  Thank you for choosing an e-visit.  Your e-visit answers were reviewed by a board certified advanced clinical practitioner to complete your personal care plan. Depending upon the condition, your plan could have included both over the counter or prescription medications.  Please review your pharmacy choice. Make sure the pharmacy is open so you can pick up prescription now. If there is a problem, you may contact your provider through [employer] and have the prescription routed to another pharmacy.  Your safety is important to [place]. If you have drug allergies check your prescription carefully.   For the next 24 hours you can use MyChart to ask questions about today's visit, request a non-urgent call back, or ask for a work or school excuse. You will get an email in the next two days asking about your experience. I hope that your e-visit has been valuable and will speed your recovery.  I spent approximately 7 minutes reviewing the patient's history, current symptoms and coordinating their plan of care today.    Meds ordered this encounter  Medications   amoxicillin-clavulanate (AUGMENTIN) 875-125 MG tablet    Sig: Take 1 tablet by mouth 2 (two) times daily for 7 days. Take with food    Dispense:  14 tablet    Refill:  0

## 2021-06-08 ENCOUNTER — Other Ambulatory Visit: Payer: Self-pay | Admitting: Osteopathic Medicine

## 2021-06-22 ENCOUNTER — Encounter: Payer: Self-pay | Admitting: *Deleted

## 2021-07-08 ENCOUNTER — Other Ambulatory Visit: Payer: Self-pay | Admitting: Sports Medicine

## 2021-07-08 NOTE — Telephone Encounter (Signed)
LVM for patient to call back to get this appt scheduled. AM

## 2021-07-12 ENCOUNTER — Encounter: Payer: Self-pay | Admitting: Advanced Practice Midwife

## 2021-07-12 ENCOUNTER — Telehealth: Payer: Self-pay | Admitting: Physician Assistant

## 2021-07-12 DIAGNOSIS — R300 Dysuria: Secondary | ICD-10-CM

## 2021-07-12 MED ORDER — NITROFURANTOIN MONOHYD MACRO 100 MG PO CAPS: 100.0000 mg | ORAL_CAPSULE | Freq: Two times a day (BID) | ORAL | 0 refills | Status: DC

## 2021-07-12 NOTE — Progress Notes (Signed)

## 2021-07-12 NOTE — Progress Notes (Signed)
I have spent 5 minutes in review of e-visit questionnaire, review and updating patient chart, medical decision making and response to patient.   Breyonna Nault Cody Cole Klugh, PA-C    

## 2021-07-18 ENCOUNTER — Telehealth: Payer: Self-pay | Admitting: Family

## 2021-07-18 DIAGNOSIS — J019 Acute sinusitis, unspecified: Secondary | ICD-10-CM

## 2021-07-18 MED ORDER — AMOXICILLIN-POT CLAVULANATE 875-125 MG PO TABS: 1.0000 | ORAL_TABLET | Freq: Two times a day (BID) | ORAL | 0 refills | Status: DC

## 2021-07-18 NOTE — Progress Notes (Signed)

## 2021-07-26 ENCOUNTER — Ambulatory Visit (INDEPENDENT_AMBULATORY_CARE_PROVIDER_SITE_OTHER): Payer: Self-pay | Admitting: Advanced Practice Midwife

## 2021-07-26 ENCOUNTER — Other Ambulatory Visit: Payer: Self-pay

## 2021-07-26 ENCOUNTER — Encounter: Payer: Self-pay | Admitting: Advanced Practice Midwife

## 2021-07-26 ENCOUNTER — Other Ambulatory Visit (HOSPITAL_COMMUNITY)
Admission: RE | Admit: 2021-07-26 | Discharge: 2021-07-26 | Disposition: A | Discharge status: Home / Self Care | Payer: Self-pay | Source: Ambulatory Visit | Attending: Advanced Practice Midwife | Admitting: Advanced Practice Midwife

## 2021-07-26 VITALS — BP 130/78 | HR 82 | Ht 64.0 in | Wt 181.0 lb

## 2021-07-26 DIAGNOSIS — Z124 Encounter for screening for malignant neoplasm of cervix: Secondary | ICD-10-CM

## 2021-07-26 DIAGNOSIS — Z01419 Encounter for gynecological examination (general) (routine) without abnormal findings: Secondary | ICD-10-CM

## 2021-07-26 DIAGNOSIS — N920 Excessive and frequent menstruation with regular cycle: Secondary | ICD-10-CM

## 2021-07-26 NOTE — Progress Notes (Signed)
Pt declined flu shot °

## 2021-07-26 NOTE — Progress Notes (Signed)
   Subjective:     Diana Fischer is a 35 y.o. female here at [facility] for a routine exam.  Current complaints: none.  Personal health questionnaire reviewed: yes.  Do you have a primary care provider? yes Do you feel safe at home? yes    Health Maintenance Due  Topic Date Due   Pneumococcal Vaccine 14-4 Years old (1 - PCV) Never done   Hepatitis C Screening  Never done   PAP SMEAR-Modifier  11/01/2018   COVID-19 Vaccine (3 - Pfizer risk series) 10/31/2019   INFLUENZA VACCINE  04/11/2021     Risk factors for chronic health problems: Smoking: never Alchohol/how much: none Pt BMI: Body mass index is 31.07 kg/m.   Gynecologic History Patient's last menstrual period was 07/01/2021. Contraception: vasectomy Last Pap: 2018. Results were: normal Last mammogram: n/a.   Obstetric History OB History  Gravida Para Term Preterm AB Living  3 3 3     3   SAB IAB Ectopic Multiple Live Births        0 3    # Outcome Date GA Lbr Len/2nd Weight Sex Delivery Anes PTL Lv  3 Term 08/17/17 [redacted]w[redacted]d 01:10 / 00:13 7 lb 1.6 oz (3.221 kg) F Vag-Vacuum EPI  LIV  2 Term 11/14/13 [redacted]w[redacted]d / 00:26 7 lb 8.3 oz (3.41 kg) M Vag-Spont EPI  LIV     Birth Comments: Born at 37 weeks, vaginal delivery.  Induced due to maternal hypertension.  Mother was GBS+, but was treated.  Went home after 2 days.  1 Term 10/11/09 [redacted]w[redacted]d   M Vag-Spont EPI  LIV     Complications: PIH (pregnancy induced hypertension), third trimester     The following portions of the patient's history were reviewed and updated as appropriate: allergies, current medications, past family history, past medical history, past social history, past surgical history, and problem list.  Review of Systems Pertinent items noted in HPI and remainder of comprehensive ROS otherwise negative.    Objective:   BP 130/78   Pulse 82   Ht 5\' 4"  (1.626 m)   Wt 181 lb (82.1 kg)   LMP 07/01/2021   BMI 31.07 kg/m  VS reviewed, nursing note  reviewed,  Constitutional: well developed, well nourished, no distress HEENT: normocephalic CV: normal rate Pulm/chest wall: normal effort Breast Exam:   performed: right breast normal without mass, skin or nipple changes or axillary nodes, left breast normal without mass, skin or nipple changes or axillary nodes Abdomen: soft Neuro: alert and oriented x 3 Skin: warm, dry Psych: affect normal Pelvic exam: Performed: Cervix pink, visually closed, without lesion, scant white creamy discharge, vaginal walls and external genitalia normal Bimanual exam: Cervix 0/long/high, firm, anterior, neg CMT, uterus nontender, nonenlarged, adnexa without tenderness, enlargement, or mass       Assessment/Plan:     1. Well woman exam with routine gynecological exam --Doing well, no gyn concerns, heavy periods but unchanged from her history - Cytology - PAP( [place])  2. Menorrhagia with regular cycle --Discussed management options, pt has tried pills in the past but did not like side effects. Discussed IUD as tx for AUB/heavy menses. Pt to consider in the future or as needed. --Husband has vasectomy so does not need contraception  3. Cervical cancer screening    Follow up in: 1  year  or as needed.   Fatima Blank, CNM 3:32 PM

## 2021-08-01 LAB — CYTOLOGY - PAP
Comment: NEGATIVE
Diagnosis: NEGATIVE
High risk HPV: NEGATIVE

## 2021-08-09 ENCOUNTER — Encounter: Payer: Self-pay | Admitting: Family Medicine

## 2021-08-09 ENCOUNTER — Other Ambulatory Visit: Payer: Self-pay

## 2021-08-09 ENCOUNTER — Ambulatory Visit (INDEPENDENT_AMBULATORY_CARE_PROVIDER_SITE_OTHER): Payer: Self-pay | Admitting: Family Medicine

## 2021-08-09 VITALS — BP 131/88 | HR 81 | Temp 98.8°F | Wt 181.1 lb

## 2021-08-09 DIAGNOSIS — J039 Acute tonsillitis, unspecified: Secondary | ICD-10-CM

## 2021-08-09 DIAGNOSIS — J0140 Acute pansinusitis, unspecified: Secondary | ICD-10-CM

## 2021-08-09 MED ORDER — DOXYCYCLINE HYCLATE 100 MG PO TABS: 100.0000 mg | ORAL_TABLET | Freq: Two times a day (BID) | ORAL | 0 refills | Status: AC

## 2021-08-09 MED ORDER — PREDNISONE 20 MG PO TABS: 40.0000 mg | ORAL_TABLET | Freq: Every day | ORAL | 0 refills | Status: AC

## 2021-08-09 NOTE — Progress Notes (Signed)
Acute Office Visit  Subjective:    Patient ID: Diana Fischer, female    DOB: 1985-11-08, 35 y.o.   MRN: [id number]  Chief Complaint  Patient presents with   Sore Throat    Recurrent x - 2 years    HPI Patient is in today for sinus infection and tonsil   Patient reports that for the past 2 years she has had recurrent right-sided throat/tonsil inflammation, pain, typically attributed to tonsillitis following sinus infections.  States occasionally it will hurt all the way into her lymph nodes in her ear.  States she has had about 4 episodes this year.  About 3 weeks ago she took a course of Augmentin for sinusitis/tonsillitis which did improve tonsillar discomfort.  However her sinus symptoms did not resolve.  States that after few days of feeling fine regarding her tonsils, will middle of last week she started having significant pain again with inflammation causing difficulty swallowing.  States her father had some leftover Augmentin so she took another 2 to 3 days worth and that seemed to help it.  Reports tonsils no longer bothering her today but she does feel some slight inflammation remaining.  She would like to get an ENT referral as this continues to happen.  Reports that her husband has been telling her she has been snoring louder for the past year or so and she has even noticed it significantly worse this past week as it has woken her up..  Additionally her husband states her breath has been smelling really bad.  She does have frequent tonsil stones primarily on the right side.  Reports the tonsillitis usually only flares up following a cold/sinus infection and always improves after a course of antibiotics.  Sinus symptoms are still present.  Significant frontal and maxillary sinus pressure with constant postnasal drainage that has not improved at all since starting Augmentin.  She continues to have mild headaches.  She denies cough, current sore throat, fevers, sneezing, ear pain,  GI/GU symptoms.   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   Left knee 07/16/18    Dizzy spells    during pregnancy   Gestational hypertension    Hemorrhoids    HTN (hypertension)    Kidney stone    Melanoma ([place])    Pre-eclampsia 11/13/2013   UTI (urinary tract infection)     Past Surgical History:  Procedure Laterality Date   MOHS SURGERY     NO PAST SURGERIES     WISDOM TOOTH EXTRACTION      Family History  Problem Relation Age of Onset   Hypertension Mother    Hyperlipidemia Mother    Hyperlipidemia Father    Hypertension Father    Colon polyps Father    Irritable bowel syndrome Father    Heart attack Maternal Grandfather    Heart disease Maternal Grandfather    Cancer Paternal Grandfather        leukemia   Uterine cancer Maternal Grandmother    Colon cancer Neg Hx    Esophageal cancer Neg Hx    Stomach cancer Neg Hx    Rectal cancer Neg Hx     Social History   Socioeconomic History   Marital status: Married    Spouse name: Not on file   Number of children: 3   Years of education: Not on file   Highest education level: Not on file  Occupational History  Not on file  Tobacco Use   Smoking status: Never   Smokeless tobacco: Never  Vaping Use   Vaping Use: Never used  Substance and Sexual Activity   Alcohol use: No   Drug use: No   Sexual activity: Yes    Birth control/protection: None    Comment: spouse - vasectomy  Other Topics Concern   Not on file  Social History Narrative   Not on file   Social Determinants of Health   Financial Resource Strain: Not on file  Food Insecurity: Not on file  Transportation Needs: Not on file  Physical Activity: Not on file  Stress: Not on file  Social Connections: Not on file  Intimate Partner Violence: Not on file    Outpatient Medications Prior to Visit  Medication Sig Dispense Refill   lisinopril (ZESTRIL) 20 MG tablet Take 1  tablet (20 mg total) by mouth daily. NO REFILLS. OVERDUE FOR A VISIT W/NEW PCP. 30 tablet 0   Multiple Vitamins-Minerals (MULTIVITAMIN GUMMIES WOMENS PO) Take 2 capsules by mouth daily.     nitrofurantoin, macrocrystal-monohydrate, (MACROBID) 100 MG capsule Take 1 capsule (100 mg total) by mouth 2 (two) times daily. 14 capsule 0   amoxicillin-clavulanate (AUGMENTIN) 875-125 MG tablet Take 1 tablet by mouth 2 (two) times daily. 14 tablet 0   No facility-administered medications prior to visit.    No Known Allergies  Review of Systems All review of systems negative except what is listed in the HPI     Objective:    Physical Exam Vitals reviewed.  Constitutional:      Appearance: She is well-developed.  HENT:     Head: Normocephalic and atraumatic.     Comments: Tonsils difficult to visualize today; frontal and maxillary sinus pressure    Right Ear: Tympanic membrane normal.     Left Ear: Tympanic membrane normal.     Mouth/Throat:     Mouth: Mucous membranes are moist.     Pharynx: No oropharyngeal exudate or posterior oropharyngeal erythema.     Tonsils: No tonsillar exudate or tonsillar abscesses.  Eyes:     Conjunctiva/sclera: Conjunctivae normal.  Neck:     Thyroid: No thyromegaly.  Cardiovascular:     Rate and Rhythm: Normal rate and regular rhythm.     Heart sounds: Normal heart sounds.  Pulmonary:     Effort: Pulmonary effort is normal.     Breath sounds: Normal breath sounds.  Musculoskeletal:     Cervical back: Normal range of motion and neck supple.  Lymphadenopathy:     Cervical: No cervical adenopathy.  Skin:    General: Skin is warm and dry.     Capillary Refill: Capillary refill takes less than 2 seconds.  Neurological:     General: No focal deficit present.     Mental Status: She is alert and oriented to person, place, and time.  Psychiatric:        Mood and Affect: Mood normal.        Behavior: Behavior normal.    BP 131/88 (BP Location: Left Arm,  Patient Position: Sitting, Cuff Size: Normal)   Pulse 81   Temp 98.8 F (37.1 C) (Oral)   Wt 181 lb 1.3 oz (82.1 kg)   SpO2 97%   BMI 31.08 kg/m  Wt Readings from Last 3 Encounters:  08/09/21 181 lb 1.3 oz (82.1 kg)  07/26/21 181 lb (82.1 kg)  02/10/20 187 lb (84.8 kg)    Health Maintenance Due  Topic Date  Due   Hepatitis C Screening  Never done    There are no preventive care reminders to display for this patient.   Lab Results  Component Value Date   TSH 1.19 12/24/2017   Lab Results  Component Value Date   WBC 10.0 12/31/2020   HGB 13.6 12/31/2020   HCT 40.9 12/31/2020   MCV 91.9 12/31/2020   PLT 329 12/31/2020   Lab Results  Component Value Date   NA 138 12/31/2020   K 4.3 12/31/2020   CO2 29 12/31/2020   GLUCOSE 84 12/31/2020   BUN 12 12/31/2020   CREATININE 0.83 12/31/2020   BILITOT 0.4 12/31/2020   ALKPHOS 189 (H) 08/17/2017   AST 16 12/31/2020   ALT 18 12/31/2020   PROT 7.1 12/31/2020   ALBUMIN 2.6 (L) 08/17/2017   CALCIUM 9.8 12/31/2020   ANIONGAP 10 08/17/2017   Lab Results  Component Value Date   CHOL 194 12/31/2020   Lab Results  Component Value Date   HDL 50 12/31/2020   Lab Results  Component Value Date   LDLCALC 121 (H) 12/31/2020   Lab Results  Component Value Date   TRIG 115 12/31/2020   Lab Results  Component Value Date   CHOLHDL 3.9 12/31/2020   Lab Results  Component Value Date   HGBA1C 5.1 12/24/2017       Assessment & Plan:    1. Acute non-recurrent pansinusitis No improvement with Augmentin. Switching to doxycycline. Adding prednisone burst. Recommend flonase, mucinex, rest, hydration, warm compresses, warm liquids with honey and lemon.  - predniSONE (DELTASONE) 20 MG tablet; Take 2 tablets (40 mg total) by mouth daily with breakfast for 5 days.  Dispense: 10 tablet; Refill: 0 - doxycycline (VIBRA-TABS) 100 MG tablet; Take 1 tablet (100 mg total) by mouth 2 (two) times daily for 7 days.  Dispense: 14 tablet;  Refill: 0  2. Tonsillitis Significant improvement with Augmentin, but still having some swelling and discomfort. Prednisone for sinus infection should also help this. Given frequent infections over the past several years and now worsened snoring, will refer to ENT. - predniSONE (DELTASONE) 20 MG tablet; Take 2 tablets (40 mg total) by mouth daily with breakfast for 5 days.  Dispense: 10 tablet; Refill: 0 - Ambulatory referral to ENT    Follow-up if symptoms worsen or fail to improve.   Purcell Nails Olevia Bowens, DNP, FNP-C

## 2021-08-09 NOTE — Patient Instructions (Signed)
For sinus infection that failed Augmentin: switching to doxycycline. Flonase nasal spray daily Warm compresses, rest, hydration, humidifier use, warm liquids with honey and lemon  For tonsillitis: Prednisone burst ENT referral

## 2021-08-22 ENCOUNTER — Encounter: Payer: Self-pay | Admitting: Family Medicine

## 2021-08-22 ENCOUNTER — Ambulatory Visit (INDEPENDENT_AMBULATORY_CARE_PROVIDER_SITE_OTHER): Payer: Self-pay | Admitting: Family Medicine

## 2021-08-22 ENCOUNTER — Other Ambulatory Visit: Payer: Self-pay

## 2021-08-22 VITALS — BP 151/86 | HR 84 | Temp 98.3°F | Ht 64.0 in | Wt 183.0 lb

## 2021-08-22 DIAGNOSIS — J039 Acute tonsillitis, unspecified: Secondary | ICD-10-CM

## 2021-08-22 MED ORDER — CLINDAMYCIN HCL 300 MG PO CAPS: 300.0000 mg | ORAL_CAPSULE | Freq: Three times a day (TID) | ORAL | 0 refills | Status: DC

## 2021-08-22 MED ORDER — FLUCONAZOLE 150 MG PO TABS: 150.0000 mg | ORAL_TABLET | Freq: Once | ORAL | 3 refills | Status: AC

## 2021-08-22 NOTE — Progress Notes (Signed)
Acute Office Visit  Subjective:    Patient ID: Diana Fischer, female    DOB: May 02, 1986, 35 y.o.   MRN: [id number]  Chief Complaint  Patient presents with   Sore Throat    HPI Patient is in today for sore throat and inflamed tonsils.  Patient states that last Thursday, right before finishing her doxycycline for recent sinus infection, patient's right-sided tonsils/throat began hurting again.  She states it is quite swollen and she was able to visibly see some irritation.  At last appointment she was given an ENT referral but they cannot see her until mid January.  She reports the pain to the right tonsil area is 8/10 with the pain now spreading into her right ear and cervical lymph nodes.  States the pain is so bad she can barely eat/swallow on that side.  States the pain is a constant ache, she is also having some headaches because of this.  The highest temperature she is read is 99.4 F.  She is not having any body aches and has not noticed any tonsil stones, no chest pain, shortness of breath, rhinorrhea, sinus pain.  She has been taking regular ibuprofen which will ease it up somewhat but the pain remains constant.  States she is miserable.      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   Left knee 07/16/18    Dizzy spells    during pregnancy   Gestational hypertension    Hemorrhoids    HTN (hypertension)    Kidney stone    Melanoma ([place])    Pre-eclampsia 11/13/2013   UTI (urinary tract infection)     Past Surgical History:  Procedure Laterality Date   MOHS SURGERY     NO PAST SURGERIES     WISDOM TOOTH EXTRACTION      Family History  Problem Relation Age of Onset   Hypertension Mother    Hyperlipidemia Mother    Hyperlipidemia Father    Hypertension Father    Colon polyps Father    Irritable bowel syndrome Father    Heart attack Maternal Grandfather    Heart disease Maternal Grandfather     Cancer Paternal Grandfather        leukemia   Uterine cancer Maternal Grandmother    Colon cancer Neg Hx    Esophageal cancer Neg Hx    Stomach cancer Neg Hx    Rectal cancer Neg Hx     Social History   Socioeconomic History   Marital status: Married    Spouse name: Not on file   Number of children: 3   Years of education: Not on file   Highest education level: Not on file  Occupational History   Not on file  Tobacco Use   Smoking status: Never   Smokeless tobacco: Never  Vaping Use   Vaping Use: Never used  Substance and Sexual Activity   Alcohol use: No   Drug use: No   Sexual activity: Yes    Birth control/protection: None    Comment: spouse - vasectomy  Other Topics Concern   Not on file  Social History Narrative   Not on file   Social Determinants of Health   Financial Resource Strain: Not on file  Food Insecurity: Not on file  Transportation Needs: Not on file  Physical Activity: Not on file  Stress: Not on file  Social Connections: Not on file  Intimate Partner Violence: Not on file    Outpatient Medications Prior to Visit  Medication Sig Dispense Refill   lisinopril (ZESTRIL) 20 MG tablet Take 1 tablet (20 mg total) by mouth daily. NO REFILLS. OVERDUE FOR A VISIT W/NEW PCP. 30 tablet 0   Multiple Vitamins-Minerals (MULTIVITAMIN GUMMIES WOMENS PO) Take 2 capsules by mouth daily.     nitrofurantoin, macrocrystal-monohydrate, (MACROBID) 100 MG capsule Take 1 capsule (100 mg total) by mouth 2 (two) times daily. 14 capsule 0   No facility-administered medications prior to visit.    No Known Allergies  Review of Systems All review of systems negative except what is listed in the HPI     Objective:    Physical Exam Vitals reviewed.  Constitutional:      Appearance: She is well-developed.  HENT:     Head: Normocephalic and atraumatic.     Comments: Tonsils difficult to visualized given mouth/tongue anatomy (see picture)    Mouth/Throat:      Mouth: Mucous membranes are moist.     Pharynx: Posterior oropharyngeal erythema present. No oropharyngeal exudate.  Musculoskeletal:     Cervical back: Normal range of motion and neck supple.  Lymphadenopathy:     Cervical: Cervical adenopathy present.  Skin:    General: Skin is warm and dry.  Neurological:     General: No focal deficit present.     Mental Status: She is alert and oriented to person, place, and time.  Psychiatric:        Mood and Affect: Mood normal.          BP (!) 151/86 (BP Location: Left Arm, Patient Position: Sitting, Cuff Size: Normal)   Pulse 84   Temp 98.3 F (36.8 C)   Ht 5\' 4"  (1.626 m)   Wt 183 lb (83 kg)   SpO2 100%   BMI 31.41 kg/m  Wt Readings from Last 3 Encounters:  08/22/21 183 lb (83 kg)  08/09/21 181 lb 1.3 oz (82.1 kg)  07/26/21 181 lb (82.1 kg)    Health Maintenance Due  Topic Date Due   Hepatitis C Screening  Never done    There are no preventive care reminders to display for this patient.   Lab Results  Component Value Date   TSH 1.19 12/24/2017   Lab Results  Component Value Date   WBC 10.0 12/31/2020   HGB 13.6 12/31/2020   HCT 40.9 12/31/2020   MCV 91.9 12/31/2020   PLT 329 12/31/2020   Lab Results  Component Value Date   NA 138 12/31/2020   K 4.3 12/31/2020   CO2 29 12/31/2020   GLUCOSE 84 12/31/2020   BUN 12 12/31/2020   CREATININE 0.83 12/31/2020   BILITOT 0.4 12/31/2020   ALKPHOS 189 (H) 08/17/2017   AST 16 12/31/2020   ALT 18 12/31/2020   PROT 7.1 12/31/2020   ALBUMIN 2.6 (L) 08/17/2017   CALCIUM 9.8 12/31/2020   ANIONGAP 10 08/17/2017   Lab Results  Component Value Date   CHOL 194 12/31/2020   Lab Results  Component Value Date   HDL 50 12/31/2020   Lab Results  Component Value Date   LDLCALC 121 (H) 12/31/2020   Lab Results  Component Value Date   TRIG 115 12/31/2020   Lab Results  Component Value Date   CHOLHDL 3.9 12/31/2020   Lab Results  Component Value Date   HGBA1C  5.1 12/24/2017       Assessment & Plan:   1. Tonsillitis  We will go ahead and try some clindamycin given the severity of her symptoms.  Recommend she try calling ENT to see if they can squeeze her in any sooner.  Continue warm salt water gargle/rinse.  I will send in some Diflucan if needed for yeast infections given her frequent antibiotic usage. Patient aware of signs/symptoms requiring further/urgent evaluation.  - clindamycin (CLEOCIN) 300 MG capsule; Take 1 capsule (300 mg total) by mouth 3 (three) times daily for 10 days.  Dispense: 30 capsule; Refill: 0 - fluconazole (DIFLUCAN) 150 MG tablet; Take 1 tablet (150 mg total) by mouth once for 1 dose.  Dispense: 1 tablet; Refill: 3  Follow-up if symptoms worsen or fail to improve.    Terrilyn Saver, NP

## 2021-08-22 NOTE — Patient Instructions (Signed)
Starting clindamycin (antibiotic) Diflucan sent in if needed for yeast infection since you have been on so many antibiotics lately Continue warm salt water rinses Keep ENT appointment

## 2021-08-22 NOTE — Progress Notes (Signed)
Throat feels worse. Now able to see ulceration on right tonsil. Increased pain to right ear. Unable to eat on rt side.   ENT referral received call. Unable to schedule until January 4th, 2023.

## 2021-09-01 ENCOUNTER — Other Ambulatory Visit: Payer: Self-pay

## 2021-09-01 ENCOUNTER — Ambulatory Visit (INDEPENDENT_AMBULATORY_CARE_PROVIDER_SITE_OTHER): Payer: Self-pay | Admitting: Family Medicine

## 2021-09-01 ENCOUNTER — Encounter: Payer: Self-pay | Admitting: Family Medicine

## 2021-09-01 DIAGNOSIS — I10 Essential (primary) hypertension: Secondary | ICD-10-CM

## 2021-09-01 MED ORDER — LISINOPRIL 20 MG PO TABS: 20.0000 mg | ORAL_TABLET | Freq: Every day | ORAL | 2 refills | Status: DC

## 2021-09-01 NOTE — Progress Notes (Signed)
Diana Fischer - 35 y.o. female MRN [id number]  Date of birth: November 11, 1985  Subjective Chief Complaint  Patient presents with   Transitions Of Care   Hypertension    HPI Also Diana Fischer is a 35 year old female here today for follow-up of hypertension.  Hypertension is currently managed with lisinopril.  She is doing well with this however does need a refill.  Blood pressure well controlled at this time.  She denies any symptoms related to hypertension including chest pain, shortness of breath, fever or chills.    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   Left knee 07/16/18    Dizzy spells    during pregnancy   Gestational hypertension    Hemorrhoids    HTN (hypertension)    Kidney stone    Melanoma ([place])    Pre-eclampsia 11/13/2013   UTI (urinary tract infection)     Past Surgical History:  Procedure Laterality Date   MOHS SURGERY     NO PAST SURGERIES     WISDOM TOOTH EXTRACTION      Social History   Socioeconomic History   Marital status: Married    Spouse name: Not on file   Number of children: 3   Years of education: Not on file   Highest education level: Not on file  Occupational History   Not on file  Tobacco Use   Smoking status: Never   Smokeless tobacco: Never  Vaping Use   Vaping Use: Never used  Substance and Sexual Activity   Alcohol use: No   Drug use: No   Sexual activity: Yes    Birth control/protection: None    Comment: spouse - vasectomy  Other Topics Concern   Not on file  Social History Narrative   Not on file   Social Determinants of Health   Financial Resource Strain: Not on file  Food Insecurity: Not on file  Transportation Needs: Not on file  Physical Activity: Not on file  Stress: Not on file  Social Connections: Not on file    Family History  Problem  Relation Age of Onset   Hypertension Mother    Hyperlipidemia Mother    Hyperlipidemia Father    Hypertension Father    Colon polyps Father    Irritable bowel syndrome Father    Heart attack Maternal Grandfather    Heart disease Maternal Grandfather    Cancer Paternal Grandfather        leukemia   Uterine cancer Maternal Grandmother    Colon cancer Neg Hx    Esophageal cancer Neg Hx    Stomach cancer Neg Hx    Rectal cancer Neg Hx     Health Maintenance  Topic Date Due   Hepatitis C Screening  Never done   COVID-19 Vaccine (3 - Pfizer risk series) 10/31/2019   INFLUENZA VACCINE  12/09/2021 (Originally 04/11/2021)   Pneumococcal Vaccine 52-74 Years old (1 - PCV) 08/09/2022 (Originally 10/03/1990)   PAP SMEAR-Modifier  07/26/2024   TETANUS/TDAP  12/25/2027   HIV Screening  Completed   HPV VACCINES  Aged Out     ----------------------------------------------------------------------------------------------------------------------------------------------------------------------------------------------------------------- Physical Exam BP 126/90 (BP Location: Left Arm, Patient Position: Sitting, Cuff Size: Normal)    Pulse 85    Resp 20    Ht 5\' 4"  (1.626 m)    Wt  183 lb (83 kg)    SpO2 99%    BMI 31.41 kg/m   Physical Exam Constitutional:      Appearance: Normal appearance.  Eyes:     General: No scleral icterus. Cardiovascular:     Rate and Rhythm: Normal rate and regular rhythm.  Pulmonary:     Effort: Pulmonary effort is normal.     Breath sounds: Normal breath sounds.  Musculoskeletal:     Cervical back: Neck supple.  Neurological:     Mental Status: She is alert.  Psychiatric:        Mood and Affect: Mood normal.        Behavior: Behavior normal.     ------------------------------------------------------------------------------------------------------------------------------------------------------------------------------------------------------------------- Assessment and Plan  HTN (hypertension) Blood pressure remains well controlled with lisinopril.  Continue current strength.  Refills provided.  Follow-up in 6 months.  We will update labs at this visit.   Meds ordered this encounter  Medications   lisinopril (ZESTRIL) 20 MG tablet    Sig: Take 1 tablet (20 mg total) by mouth daily.    Dispense:  90 tablet    Refill:  2    Return in about 6 months (around 03/02/2022) for HTN/Labs.    This visit occurred during the SARS-CoV-2 public health emergency.  Safety protocols were in place, including screening questions prior to the visit, additional usage of staff PPE, and extensive cleaning of exam room while observing appropriate contact time as indicated for disinfecting solutions. Molecular the midsized blood [place] is a very redesign the whole body and everything this year

## 2021-09-01 NOTE — Assessment & Plan Note (Signed)
Blood pressure remains well controlled with lisinopril.  Continue current strength.  Refills provided.  Follow-up in 6 months.  We will update labs at this visit.

## 2021-09-06 ENCOUNTER — Telehealth: Payer: Self-pay

## 2021-09-06 ENCOUNTER — Other Ambulatory Visit: Payer: Self-pay | Admitting: Family Medicine

## 2021-09-06 DIAGNOSIS — J039 Acute tonsillitis, unspecified: Secondary | ICD-10-CM

## 2021-09-06 MED ORDER — CLINDAMYCIN HCL 300 MG PO CAPS: 300.0000 mg | ORAL_CAPSULE | Freq: Three times a day (TID) | ORAL | 0 refills | Status: AC

## 2021-09-06 NOTE — Telephone Encounter (Signed)
Patient called to report that she has finished her abx and woke up this morning with a sore throat again. She doesn't see the ENT until next week. She is looking for another round of abx so it doesn't "flair back up" til she can see the ENT.

## 2021-09-06 NOTE — Telephone Encounter (Signed)
Additional 7 days of clindamycin ordered.  Take with probiotic.

## 2021-09-06 NOTE — Telephone Encounter (Signed)
Patient aware the medication was sent to the pharmacy and to make sure to take the probiotic.

## 2021-11-18 ENCOUNTER — Telehealth: Payer: Self-pay | Admitting: Family Medicine

## 2021-11-18 DIAGNOSIS — B9689 Other specified bacterial agents as the cause of diseases classified elsewhere: Secondary | ICD-10-CM

## 2021-11-18 DIAGNOSIS — J019 Acute sinusitis, unspecified: Secondary | ICD-10-CM

## 2021-11-18 MED ORDER — AMOXICILLIN-POT CLAVULANATE 875-125 MG PO TABS: 1.0000 | ORAL_TABLET | Freq: Two times a day (BID) | ORAL | 0 refills | Status: AC

## 2021-11-18 NOTE — Progress Notes (Signed)

## 2021-12-29 ENCOUNTER — Telehealth: Payer: Self-pay | Admitting: Family Medicine

## 2021-12-29 DIAGNOSIS — R300 Dysuria: Secondary | ICD-10-CM

## 2021-12-29 MED ORDER — NITROFURANTOIN MONOHYD MACRO 100 MG PO CAPS: 100.0000 mg | ORAL_CAPSULE | Freq: Two times a day (BID) | ORAL | 0 refills | Status: AC

## 2021-12-29 NOTE — Progress Notes (Signed)

## 2022-03-02 ENCOUNTER — Ambulatory Visit: Payer: Self-pay | Admitting: Family Medicine

## 2022-06-29 ENCOUNTER — Telehealth: Payer: Self-pay | Admitting: Family Medicine

## 2022-06-29 DIAGNOSIS — R300 Dysuria: Secondary | ICD-10-CM

## 2022-06-29 MED ORDER — NITROFURANTOIN MONOHYD MACRO 100 MG PO CAPS: 100.0000 mg | ORAL_CAPSULE | Freq: Two times a day (BID) | ORAL | 0 refills | Status: AC

## 2022-06-29 NOTE — Progress Notes (Signed)

## 2022-07-19 ENCOUNTER — Telehealth: Payer: Self-pay | Admitting: General Practice

## 2022-07-19 NOTE — Telephone Encounter (Signed)
Transition Care Management Follow-up Telephone Call Date of discharge and from where: 07/15/22 from [facility] How have you been since you were released from the hospital? Doing better. Diarrhea has resolved. Her appetite is coming back slowly. She is waiting to hear back from GI office but if they don't call this week. She plans to call.  Any questions or concerns? No  Items Reviewed: Did the pt receive and understand the discharge instructions provided? Yes  Medications obtained and verified? No  Other? No  Any new allergies since your discharge? No  Dietary orders reviewed? No Do you have support at home? Yes   Home Care and Equipment/Supplies: Were home health services ordered? no   Functional Questionnaire: (I = Independent and D = Dependent) ADLs: I  Bathing/Dressing- I  Meal Prep- I  Eating- I  Maintaining continence- I  Transferring/Ambulation- I  Managing Meds- I  Follow up appointments reviewed:  PCP Hospital f/u appt confirmed? No   Specialist Hospital f/u appt confirmed? No  Patient will be contacting GI office this week.  Are transportation arrangements needed? No  If their condition worsens, is the pt aware to call PCP or go to the Emergency Dept.? Yes Was the patient provided with contact information for the PCP's office or ED? Yes Was to pt encouraged to call back with questions or concerns? Yes

## 2022-09-21 ENCOUNTER — Telehealth: Payer: Self-pay | Admitting: Family Medicine

## 2022-09-21 DIAGNOSIS — R300 Dysuria: Secondary | ICD-10-CM

## 2022-09-21 MED ORDER — NITROFURANTOIN MONOHYD MACRO 100 MG PO CAPS: 100.0000 mg | ORAL_CAPSULE | Freq: Two times a day (BID) | ORAL | 0 refills | Status: AC

## 2022-09-21 NOTE — Progress Notes (Signed)

## 2023-01-28 ENCOUNTER — Telehealth: Payer: Self-pay | Admitting: Family Medicine

## 2023-01-28 DIAGNOSIS — N3000 Acute cystitis without hematuria: Secondary | ICD-10-CM

## 2023-01-28 MED ORDER — NITROFURANTOIN MONOHYD MACRO 100 MG PO CAPS: 100.0000 mg | ORAL_CAPSULE | Freq: Two times a day (BID) | ORAL | 0 refills | Status: AC

## 2023-01-28 NOTE — Progress Notes (Signed)
E-Visit for Urinary Problems  We are sorry that you are not feeling well.  Here is how we plan to help!  Based on what you shared with me it looks like you most likely have a simple urinary tract infection.  A UTI (Urinary Tract Infection) is a bacterial infection of the bladder.  Most cases of urinary tract infections are simple to treat but a key part of your care is to encourage you to drink plenty of fluids and watch your symptoms carefully.  I have prescribed MacroBid 100 mg twice a day for 5 days.  Your symptoms should gradually improve. Call us if the burning in your urine worsens, you develop worsening fever, back pain or pelvic pain or if your symptoms do not resolve after completing the antibiotic.  Urinary tract infections can be prevented by drinking plenty of water to keep your body hydrated.  Also be sure when you wipe, wipe from front to back and don't hold it in!  If possible, empty your bladder every 4 hours.  HOME CARE Drink plenty of fluids Compete the full course of the antibiotics even if the symptoms resolve Remember, when you need to go.go. Holding in your urine can increase the likelihood of getting a UTI! GET HELP RIGHT AWAY IF: You cannot urinate You get a high fever Worsening back pain occurs You see blood in your urine You feel sick to your stomach or throw up You feel like you are going to pass out  MAKE SURE YOU  Understand these instructions. Will watch your condition. Will get help right away if you are not doing well or get worse.   Thank you for choosing an e-visit.  Your e-visit answers were reviewed by a board certified advanced clinical practitioner to complete your personal care plan. Depending upon the condition, your plan could have included both over the counter or prescription medications.  Please review your pharmacy choice. Make sure the pharmacy is open so you can pick up prescription now. If there is a problem, you may contact your  provider through MyChart messaging and have the prescription routed to another pharmacy.  Your safety is important to us. If you have drug allergies check your prescription carefully.   For the next 24 hours you can use MyChart to ask questions about today's visit, request a non-urgent call back, or ask for a work or school excuse. You will get an email in the next two days asking about your experience. I hope that your e-visit has been valuable and will speed your recovery.    have provided 5 minutes of non face to face time during this encounter for chart review and documentation.   

## 2023-02-02 ENCOUNTER — Other Ambulatory Visit: Payer: Self-pay | Admitting: Family Medicine

## 2023-02-04 ENCOUNTER — Ambulatory Visit
Admission: EM | Admit: 2023-02-04 | Discharge: 2023-02-04 | Disposition: A | Discharge status: Home / Self Care | Payer: Self-pay | Attending: Internal Medicine | Admitting: Internal Medicine

## 2023-02-04 ENCOUNTER — Encounter: Payer: Self-pay | Admitting: Emergency Medicine

## 2023-02-04 DIAGNOSIS — R109 Unspecified abdominal pain: Secondary | ICD-10-CM

## 2023-02-04 LAB — POCT URINALYSIS DIP (MANUAL ENTRY)
Bilirubin, UA: NEGATIVE
Blood, UA: NEGATIVE
Glucose, UA: NEGATIVE mg/dL
Ketones, POC UA: NEGATIVE mg/dL
Leukocytes, UA: NEGATIVE
Nitrite, UA: NEGATIVE
Protein Ur, POC: NEGATIVE mg/dL
Spec Grav, UA: 1.025 (ref 1.010–1.025)
Urobilinogen, UA: 0.2 E.U./dL
pH, UA: 6 (ref 5.0–8.0)

## 2023-02-04 MED ORDER — IBUPROFEN 600 MG PO TABS: 600.0000 mg | ORAL_TABLET | Freq: Four times a day (QID) | ORAL | 0 refills | Status: DC | PRN

## 2023-02-04 MED ORDER — METHOCARBAMOL 500 MG PO TABS: 500.0000 mg | ORAL_TABLET | Freq: Every evening | ORAL | 0 refills | Status: DC | PRN

## 2023-02-04 MED ORDER — TAMSULOSIN HCL 0.4 MG PO CAPS: 0.4000 mg | ORAL_CAPSULE | Freq: Every day | ORAL | 0 refills | Status: DC

## 2023-02-04 NOTE — ED Triage Notes (Signed)
Pt has had frequency x 1 week  Pt was out of town - and did an e-visit  Last day of antibiotic Right back pain - radiated around to  right groin on Friday night  Here w/ husband  Hx of kidney stone in past

## 2023-02-04 NOTE — ED Provider Notes (Signed)
[facility]    CSN: [id number] Arrival date & time: 02/04/23  0807      History   Chief Complaint Chief Complaint  Patient presents with   Flank Pain    right    HPI Diana Fischer is a 37 y.o. female with a history of recurrent UTIs comes to urgent care with complaints of right flank pain of 1 week duration.  Patient was seen via e-visit and prescribed nitrofurantoin.  Patient is due to complete her course of antibiotics later today.  She continues to have right flank pain which is dull and occasionally sharp pain.  Pain is associated with some tightness and stiffness in the lower back.  She denies any fever or chills.  She has urinary frequency but denies any urgency or dysuria.  No nausea or vomiting.  Patient denies any fall, heavy lifting or trauma to her back.  Pain does radiate to her buttocks but it does not radiate into her lower extremities.  No weakness or numbness in the lower extremities.  No history of sciatica.  Patient also endorses some stress incontinence especially when she coughs.  Medical records review from a unique source reveals that the patient says that recurrent urinary frequency symptoms.  Most of the urinalysis have been negative for UTI and the urine cultures have had insignificant growth.  CT scan done in 2023 showed multiple renal cyst with the largest in the right kidney measuring 3.1 cm.  CT also showed nonobstructive stone.  CT scan also reveals bladder wall thickening.  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   Left knee 07/16/18    Dizzy spells    during pregnancy   Gestational hypertension    Hemorrhoids    HTN (hypertension)    Kidney stone    Melanoma (HCC)    Pre-eclampsia 11/13/2013   UTI (urinary tract infection)     Patient Active Problem List   Diagnosis Date Noted   Acute abdominal pain in left lower quadrant 12/31/2020   Calcium  pyrophosphate arthropathy 07/16/2018   HTN (hypertension) 12/24/2017   Obesity 12/24/2017   Cardiac murmur 06/06/2017   Family history of heart disease 07/31/2016    Past Surgical History:  Procedure Laterality Date   MOHS SURGERY     NO PAST SURGERIES     WISDOM TOOTH EXTRACTION      OB History     Gravida  3   Para  3   Term  3   Preterm      AB      Living  3      SAB      IAB      Ectopic      Multiple  0   Live Births  3            Home Medications    Prior to Admission medications   Medication Sig Start Date End Date Taking? Authorizing Provider  ibuprofen (ADVIL) 600 MG tablet Take 1 tablet (600 mg total) by mouth every 6 (six) hours as needed. 02/04/23  Yes Eilyn Polack, Britta Mccreedy, MD  methocarbamol (ROBAXIN) 500 MG tablet Take 1 tablet (500 mg total) by mouth at bedtime as needed for muscle spasms. 02/04/23  Yes Veatrice Eckstein, Britta Mccreedy, MD  tamsulosin (FLOMAX) 0.4 MG CAPS capsule Take 1 capsule (0.4 mg total) by mouth daily. 02/04/23  Yes Lylla Eifler, Britta Mccreedy, MD  lisinopril (ZESTRIL) 20 MG tablet Take 1 tablet (20 mg total) by mouth daily. 09/01/21   Everrett Coombe, DO  Multiple Vitamins-Minerals (MULTIVITAMIN GUMMIES WOMENS PO) Take 2 capsules by mouth daily.    [provider]  nitrofurantoin, macrocrystal-monohydrate, (MACROBID) 100 MG capsule Take 1 capsule (100 mg total) by mouth 2 (two) times daily for 7 days. 01/28/23 02/04/23  Delorse Lek, FNP    Family History Family History  Problem Relation Age of Onset   Hypertension Mother    Hyperlipidemia Mother    Hyperlipidemia Father    Hypertension Father    Colon polyps Father    Irritable bowel syndrome Father    Heart attack Maternal Grandfather    Heart disease Maternal Grandfather    Cancer Paternal Grandfather        leukemia   Uterine cancer Maternal Grandmother    Colon cancer Neg Hx    Esophageal cancer Neg Hx    Stomach cancer Neg Hx    Rectal cancer Neg Hx     Social  History Social History   Tobacco Use   Smoking status: Never   Smokeless tobacco: Never  Vaping Use   Vaping Use: Never used  Substance Use Topics   Alcohol use: No   Drug use: No     Allergies   Patient has no known allergies.   Review of Systems Review of Systems As per HPI  Physical Exam Triage Vital Signs ED Triage Vitals  Enc Vitals Group     BP 02/04/23 0818 120/81     Pulse Rate 02/04/23 0818 89     Resp 02/04/23 0818 16     Temp 02/04/23 0818 98.6 F (37 C)     Temp Source 02/04/23 0818 Oral     SpO2 02/04/23 0818 97 %     Weight 02/04/23 0821 182 lb 15.7 oz (83 kg)     Height 02/04/23 0821 5\' 4"  (1.626 m)     Head Circumference --      Peak Flow --      Pain Score 02/04/23 0820 5     Pain Loc --      Pain Edu? --      Excl. in GC? --    No data found.  Updated Vital Signs BP 120/81 (BP Location: Left Arm)   Pulse 89   Temp 98.6 F (37 C) (Oral)   Resp 16   Ht 5\' 4"  (1.626 m)   Wt 83 kg   LMP 01/18/2023 (Exact Date)   SpO2 97%   BMI 31.41 kg/m   Visual Acuity Right Eye Distance:   Left Eye Distance:   Bilateral Distance:    Right Eye Near:   Left Eye Near:    Bilateral Near:     Physical Exam Vitals and nursing note reviewed.  Constitutional:      General: She is not in acute distress.    Appearance: She is not ill-appearing.  HENT:     Mouth/Throat:     Mouth: Mucous membranes are moist.     Pharynx: No posterior oropharyngeal erythema.  Cardiovascular:     Rate and Rhythm: Normal rate and regular rhythm.     Pulses: Normal pulses.     Heart sounds: Normal heart sounds.  Pulmonary:     Effort: Pulmonary effort is normal.     Breath sounds: Normal breath sounds.  Abdominal:     General: Bowel sounds are normal.     Palpations: Abdomen  is soft.  Neurological:     Mental Status: She is alert.      UC Treatments / Results  Labs (all labs ordered are listed, but only abnormal results are displayed) Labs Reviewed  POCT  URINALYSIS DIP (MANUAL ENTRY) - Abnormal; Notable for the following components:      Result Value   Clarity, UA cloudy (*)    All other components within normal limits    EKG   Radiology No results found.  Procedures Procedures (including critical care time)  Medications Ordered in UC Medications - No data to display  Initial Impression / Assessment and Plan / UC Course  I have reviewed the triage vital signs and the nursing notes.  Pertinent labs & imaging results that were available during my care of the patient were reviewed by me and considered in my medical decision making (see chart for details).     1.  Right flank pain: Point-of-care urinalysis is negative for hemoglobin, leukocyte Estrace or nitrites Patient is advised to make arrangements to be seen by urologist Increase oral fluid intake Tamsulosin orally daily Heating pad use discussed. Ibuprofen as needed for pain Robaxin at bedtime as needed for stiffness or muscle spasms in the back Return precautions given Final Clinical Impressions(s) / UC Diagnoses   Final diagnoses:  Right flank pain     Discharge Instructions      Please increase oral fluid intake Your urine analysis is negative for blood or urinary tract infection I think he will benefit from urology evaluation to help with management Please use heating pad on a 20-minute on-20 minutes off cycle If you have worsening symptoms please return to urgent care to be reevaluated.   ED Prescriptions     Medication Sig Dispense Auth. Provider   ibuprofen (ADVIL) 600 MG tablet Take 1 tablet (600 mg total) by mouth every 6 (six) hours as needed. 30 tablet Janel Beane, Britta Mccreedy, MD   tamsulosin (FLOMAX) 0.4 MG CAPS capsule Take 1 capsule (0.4 mg total) by mouth daily. 7 capsule Kelsey Edman, Britta Mccreedy, MD   methocarbamol (ROBAXIN) 500 MG tablet Take 1 tablet (500 mg total) by mouth at bedtime as needed for muscle spasms. 20 tablet Dorinne Graeff, Britta Mccreedy, MD       PDMP not reviewed this encounter.   Merrilee Jansky, MD 02/04/23 1032

## 2023-02-04 NOTE — Discharge Instructions (Addendum)
Please increase oral fluid intake Your urine analysis is negative for blood or urinary tract infection I think he will benefit from urology evaluation to help with management Please use heating pad on a 20-minute on-20 minutes off cycle If you have worsening symptoms please return to urgent care to be reevaluated.

## 2023-02-07 ENCOUNTER — Encounter: Payer: Self-pay | Admitting: Family Medicine

## 2023-02-07 ENCOUNTER — Ambulatory Visit (INDEPENDENT_AMBULATORY_CARE_PROVIDER_SITE_OTHER): Payer: Self-pay

## 2023-02-07 ENCOUNTER — Ambulatory Visit: Payer: Self-pay | Admitting: Family Medicine

## 2023-02-07 VITALS — BP 152/79 | HR 80 | Wt 182.0 lb

## 2023-02-07 DIAGNOSIS — R109 Unspecified abdominal pain: Secondary | ICD-10-CM

## 2023-02-07 DIAGNOSIS — N3289 Other specified disorders of bladder: Secondary | ICD-10-CM

## 2023-02-07 DIAGNOSIS — I10 Essential (primary) hypertension: Secondary | ICD-10-CM

## 2023-02-07 DIAGNOSIS — R000 Tachycardia, unspecified: Secondary | ICD-10-CM

## 2023-02-07 DIAGNOSIS — N200 Calculus of kidney: Secondary | ICD-10-CM

## 2023-02-07 MED ORDER — LISINOPRIL 10 MG PO TABS: 10.0000 mg | ORAL_TABLET | Freq: Every day | ORAL | 1 refills | Status: DC

## 2023-02-07 NOTE — Progress Notes (Signed)
Diana Fischer - 37 y.o. female MRN [id number]  Date of birth: 01/14/1986  Subjective Chief Complaint  Patient presents with   Back Pain   Urinary Frequency    HPI Diana Fischer is a 37 y.o. female here today for follow up.    Seen at urgent care recently with R flank pain.  Initially treated for UTI with macrobid.  Previously noted to have renal cyst  and non-obstructive stone on CT along with bladder wall thickening.  She was treated for presumptive kidney/ureteral stone and started on Flomax.  She also given muscle relaxer she was told that could be pulled muscle.  She has had a little bit of improvement in pain but still with quite a bit of right-sided flank pain.  She was recommended to follow-up with her PCP to discuss referral to urology due to other CT findings as well as possible stone.  She has noted that her blood pressure has been running low with lisinopril as well as addition of tamsulosin.  She did hold this yesterday due to hypotension.  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   Left knee 07/16/18    Dizzy spells    during pregnancy   Gestational hypertension    Hemorrhoids    HTN (hypertension)    Kidney stone    Melanoma (HCC)    Pre-eclampsia 11/13/2013   UTI (urinary tract infection)     Past Surgical History:  Procedure Laterality Date   MOHS SURGERY     NO PAST SURGERIES     WISDOM TOOTH EXTRACTION      Social History   Socioeconomic History   Marital status: Married    Spouse name: Not on file   Number of children: 3   Years of education: Not on file   Highest education level: Associate degree: occupational, Scientist, product/process development, or vocational program  Occupational History    Not on file  Tobacco Use   Smoking status: Never   Smokeless tobacco: Never  Vaping Use   Vaping Use: Never used  Substance and Sexual Activity   Alcohol use: No   Drug use: No   Sexual activity: Yes    Birth control/protection: None    Comment: spouse - vasectomy  Other Topics Concern   Not on file  Social History Narrative   Not on file   Social Determinants of Health   Financial Resource Strain: Medium Risk (02/06/2023)   Overall Financial Resource Strain (CARDIA)    Difficulty of Paying Living Expenses: Somewhat hard  Food Insecurity: No Food Insecurity (02/06/2023)   Hunger Vital Sign    Worried About Running Out of Food in the Last Year: Never true    Ran Out of Food in the Last Year: Never true  Transportation Needs: No Transportation Needs (02/06/2023)   PRAPARE - Administrator, Civil Service (Medical): No    Lack of Transportation (Non-Medical): No  Physical Activity: Sufficiently Active (02/06/2023)   Exercise Vital Sign    Days of Exercise per Week: 4 days    Minutes of Exercise per Session: 60 min  Stress: No Stress Concern Present (02/06/2023)   [employer] of Occupational Health -  Occupational Stress Questionnaire    Feeling of Stress : Not at all  Social Connections: Socially Integrated (02/06/2023)   Social Connection and Isolation Panel [NHANES]    Frequency of Communication with Friends and Family: More than three times a week    Frequency of Social Gatherings with Friends and Family: Three times a week    Attends Religious Services: More than 4 times per year    Active Member of Clubs or Organizations: Yes    Attends Engineer, structural: More than 4 times per year    Marital Status: Married    Family History  Problem Relation Age of Onset   Hypertension Mother    Hyperlipidemia Mother    Hyperlipidemia Father    Hypertension Father    Colon polyps Father    Irritable bowel syndrome Father    Heart attack Maternal  Grandfather    Heart disease Maternal Grandfather    Cancer Paternal Grandfather        leukemia   Uterine cancer Maternal Grandmother    Colon cancer Neg Hx    Esophageal cancer Neg Hx    Stomach cancer Neg Hx    Rectal cancer Neg Hx     Health Maintenance  Topic Date Due   Hepatitis C Screening  Never done   COVID-19 Vaccine (3 - 2023-24 season) 05/12/2022   INFLUENZA VACCINE  04/12/2023   PAP SMEAR-Modifier  07/26/2024   DTaP/Tdap/Td (2 - Td or Tdap) 12/25/2027   HIV Screening  Completed   HPV VACCINES  Aged Out     ----------------------------------------------------------------------------------------------------------------------------------------------------------------------------------------------------------------- Physical Exam BP (!) 152/79 (BP Location: Left Arm, Patient Position: Sitting, Cuff Size: Normal)   Pulse 80   Wt 182 lb (82.6 kg)   LMP 01/18/2023 (Exact Date)   BMI 31.24 kg/m   Physical Exam Constitutional:      Appearance: Normal appearance.  HENT:     Head: Normocephalic and atraumatic.  Eyes:     General: No scleral icterus. Cardiovascular:     Rate and Rhythm: Normal rate and regular rhythm.  Pulmonary:     Effort: Pulmonary effort is normal.     Breath sounds: Normal breath sounds.  Neurological:     Mental Status: She is alert.  Psychiatric:        Mood and Affect: Mood normal.        Behavior: Behavior normal.     ------------------------------------------------------------------------------------------------------------------------------------------------------------------------------------------------------------------- Assessment and Plan  Renal stones Recent right flank pain.  Presumed kidney stone he and the urinalysis is normal.  No additional imaging was ordered in urgent care.  Proceed with KUB.  I think she can hold tamsulosin for now.  Referral placed to urology as well for bladder thickening and renal  cysts.  Tachycardia Encouraged to stay well-hydrated.  Checking TSH, metabolic panel and CBC.  HTN (hypertension) Blood pressure is elevated today.  She was getting hypotensive with combination of lisinopril and tamsulosin.  I think she can hold the tamsulosin for now and restart lisinopril.   Meds ordered this encounter  Medications   lisinopril (ZESTRIL) 10 MG tablet    Sig: Take 1 tablet (10 mg total) by mouth daily.    Dispense:  90 tablet    Refill:  1    No follow-ups on file.    This visit occurred during the SARS-CoV-2 public health emergency.  Safety protocols were in place, including screening questions prior to the visit, additional usage of staff PPE, and extensive cleaning of exam room  while observing appropriate contact time as indicated for disinfecting solutions.

## 2023-02-07 NOTE — Assessment & Plan Note (Signed)
Recent right flank pain.  Presumed kidney stone he and the urinalysis is normal.  No additional imaging was ordered in urgent care.  Proceed with KUB.  I think she can hold tamsulosin for now.  Referral placed to urology as well for bladder thickening and renal cysts.

## 2023-02-07 NOTE — Assessment & Plan Note (Signed)
Encouraged to stay well-hydrated.  Checking TSH, metabolic panel and CBC.

## 2023-02-07 NOTE — Assessment & Plan Note (Signed)
Blood pressure is elevated today.  She was getting hypotensive with combination of lisinopril and tamsulosin.  I think she can hold the tamsulosin for now and restart lisinopril.

## 2023-02-08 LAB — COMPLETE METABOLIC PANEL WITH GFR
AG Ratio: 1.6 (calc) (ref 1.0–2.5)
ALT: 20 U/L (ref 6–29)
AST: 16 U/L (ref 10–30)
Albumin: 4.4 g/dL (ref 3.6–5.1)
Alkaline phosphatase (APISO): 82 U/L (ref 31–125)
BUN: 9 mg/dL (ref 7–25)
CO2: 26 mmol/L (ref 20–32)
Calcium: 9.4 mg/dL (ref 8.6–10.2)
Chloride: 104 mmol/L (ref 98–110)
Creat: 0.61 mg/dL (ref 0.50–0.97)
Globulin: 2.8 g/dL (calc) (ref 1.9–3.7)
Glucose, Bld: 74 mg/dL (ref 65–99)
Potassium: 4.4 mmol/L (ref 3.5–5.3)
Sodium: 137 mmol/L (ref 135–146)
Total Bilirubin: 0.4 mg/dL (ref 0.2–1.2)
Total Protein: 7.2 g/dL (ref 6.1–8.1)
eGFR: 117 mL/min/{1.73_m2} (ref >=60)

## 2023-02-08 LAB — CBC WITH DIFFERENTIAL/PLATELET
Absolute Monocytes: 706 cells/uL (ref 200–950)
Basophils Absolute: 33 cells/uL (ref 0–200)
Basophils Relative: 0.4 %
Eosinophils Absolute: 116 cells/uL (ref 15–500)
Eosinophils Relative: 1.4 %
HCT: 40.6 % (ref 35.0–45.0)
Hemoglobin: 13.4 g/dL (ref 11.7–15.5)
Lymphs Abs: 2366 cells/uL (ref 850–3900)
MCH: 29.6 pg (ref 27.0–33.0)
MCHC: 33 g/dL (ref 32.0–36.0)
MCV: 89.8 fL (ref 80.0–100.0)
MPV: 9 fL (ref 7.5–12.5)
Monocytes Relative: 8.5 %
Neutro Abs: 5080 cells/uL (ref 1500–7800)
Neutrophils Relative %: 61.2 %
Platelets: 376 10*3/uL (ref 140–400)
RBC: 4.52 10*6/uL (ref 3.80–5.10)
RDW: 11.8 % (ref 11.0–15.0)
Total Lymphocyte: 28.5 %
WBC: 8.3 10*3/uL (ref 3.8–10.8)

## 2023-02-08 LAB — TSH: TSH: 1.77 mIU/L

## 2023-02-12 ENCOUNTER — Ambulatory Visit: Payer: Self-pay | Admitting: Urology

## 2023-02-12 ENCOUNTER — Encounter: Payer: Self-pay | Admitting: Urology

## 2023-02-12 VITALS — BP 123/82 | HR 84 | Ht 63.5 in | Wt 182.0 lb

## 2023-02-12 DIAGNOSIS — N393 Stress incontinence (female) (male): Secondary | ICD-10-CM

## 2023-02-12 DIAGNOSIS — Z87442 Personal history of urinary calculi: Secondary | ICD-10-CM

## 2023-02-12 DIAGNOSIS — R109 Unspecified abdominal pain: Secondary | ICD-10-CM

## 2023-02-12 DIAGNOSIS — R93422 Abnormal radiologic findings on diagnostic imaging of left kidney: Secondary | ICD-10-CM

## 2023-02-12 DIAGNOSIS — N281 Cyst of kidney, acquired: Secondary | ICD-10-CM

## 2023-02-12 DIAGNOSIS — N200 Calculus of kidney: Secondary | ICD-10-CM

## 2023-02-12 DIAGNOSIS — Z87440 Personal history of urinary (tract) infections: Secondary | ICD-10-CM

## 2023-02-12 LAB — MICROSCOPIC EXAMINATION
Cast Type: NONE SEEN
Casts: NONE SEEN /lpf
Crystal Type: NONE SEEN
Crystals: NONE SEEN
Renal Epithel, UA: NONE SEEN /hpf
Trichomonas, UA: NONE SEEN
Yeast, UA: NONE SEEN

## 2023-02-12 LAB — URINALYSIS, ROUTINE W REFLEX MICROSCOPIC
Bilirubin, UA: NEGATIVE
Glucose, UA: NEGATIVE
Ketones, UA: NEGATIVE
Nitrite, UA: NEGATIVE
Protein,UA: NEGATIVE
RBC, UA: NEGATIVE
Specific Gravity, UA: 1.025 (ref 1.005–1.030)
Urobilinogen, Ur: 0.2 mg/dL (ref 0.2–1.0)
pH, UA: 6 (ref 5.0–7.5)

## 2023-02-12 LAB — BLADDER SCAN AMB NON-IMAGING

## 2023-02-12 NOTE — Progress Notes (Signed)
Assessment: 1. Flank pain   2. History of UTI   3. Renal cyst   4. Nephrolithiasis   5. SUI (stress urinary incontinence, female)     Plan: I personally reviewed the patient's chart including provider notes, lab and imaging results. I am unable to visualize the CT study as it is not available in our PACS system. Dietary modifications to reduce risk of nephrolithiasis discussed and information provided. Methods to reduce the risk of UTI's discussed including increased fluid intake, timed and double voiding, daily cranberry supplement, and daily probiotics. Recommend urine culture prior to antibiotics for UTI symptoms.   Management options for stress incontinence discussed including pelvic floor exercises, pessary use, and surgical management. Instructions on pelvic floor exercises provided Return to office in 2 months  Chief Complaint:  Chief Complaint  Patient presents with   Flank Pain    History of Present Illness:  Diana Fischer is a 37 y.o. female who is seen in consultation from Everrett Coombe, DO for evaluation of nephrolithiasis, renal cyst, and bladder wall thickening on CT. CT imaging from 11/23 showed small focal hyperattenuating focus in the lower pole left kidney likely representing a calculus, several renal cyst with benign appearance in the right kidney.  She was recently seen at urgent care on 02/04/2023 for right flank pain, frequency, and dysuria.  Urinalysis was unremarkable. KUB was unremarkable by report.  She was given tamsulosin.  Her symptoms resolved.  She has a history of spontaneous passage of a kidney stone years ago.    She has a history of UTI's about every 3-4 months.  These have been treated by telemedicine or her PCP.  Typical symptoms include lower abdominal cramping, low back pain, frequency, occasional dysuria, nausea, and headaches.  Some of these UTI's have been associated with intercourse.  Her symptoms generally resolve with  antibiotics. Urine culture from 11/23:  >100K gm +   She has stress urinary incontinence and occasional leakage without awareness.  She is not using pads.  No prior treatment.     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   Left knee 07/16/18    Dizzy spells    during pregnancy   Gestational hypertension    Hemorrhoids    HTN (hypertension)    Kidney stone    Melanoma (HCC)    Pre-eclampsia 11/13/2013   UTI (urinary tract infection)     Past Surgical History:  Past Surgical History:  Procedure Laterality Date   MOHS SURGERY     NO PAST SURGERIES     WISDOM TOOTH EXTRACTION      Allergies:  No Known Allergies  Family History:  Family History  Problem Relation Age of Onset   Hypertension Mother    Hyperlipidemia Mother    Hyperlipidemia Father    Hypertension Father    Colon polyps Father    Irritable bowel syndrome Father    Heart attack Maternal Grandfather    Heart disease Maternal Grandfather    Cancer Paternal Grandfather        leukemia   Uterine cancer Maternal Grandmother    Colon cancer Neg Hx    Esophageal cancer Neg Hx    Stomach cancer Neg Hx    Rectal cancer Neg Hx     Social History:  Social History   Tobacco Use   Smoking status: Never   Smokeless tobacco: Never  Vaping Use   Vaping Use: Never used  Substance Use Topics   Alcohol use: No   Drug use: No    Review of symptoms:  Constitutional:  Negative for unexplained weight loss, night sweats, fever, chills ENT:  Negative for nose bleeds, sinus pain, painful swallowing CV:  Negative for chest pain, shortness of breath, exercise intolerance, palpitations, loss of consciousness Resp:  Negative for cough, wheezing, shortness of breath GI:  Negative for nausea, vomiting, diarrhea, bloody stools GU:  Positives noted in HPI; otherwise negative for gross hematuria, dysuria Neuro:  Negative for  seizures, poor balance, limb weakness, slurred speech Psych:  Negative for lack of energy, depression, anxiety Endocrine:  Negative for polydipsia, polyuria, symptoms of hypoglycemia (dizziness, hunger, sweating) Hematologic:  Negative for anemia, purpura, petechia, prolonged or excessive bleeding, use of anticoagulants  Allergic:  Negative for difficulty breathing or choking as a result of exposure to anything; no shellfish allergy; no allergic response (rash/itch) to materials, foods  Physical exam: BP 123/82   Pulse 84   Ht 5' 3.5" (1.613 m)   Wt 182 lb (82.6 kg)   LMP 01/18/2023 (Exact Date)   BMI 31.73 kg/m  GENERAL APPEARANCE:  Well appearing, well developed, well nourished, NAD HEENT: Atraumatic, Normocephalic, oropharynx clear. NECK: Supple without lymphadenopathy or thyromegaly. LUNGS: Clear to auscultation bilaterally. HEART: Regular Rate and Rhythm without murmurs, gallops, or rubs. ABDOMEN: Soft, non-tender, No Masses. EXTREMITIES: Moves all extremities well.  Without clubbing, cyanosis, or edema. NEUROLOGIC:  Alert and oriented x 3, normal gait, CN II-XII grossly intact.  MENTAL STATUS:  Appropriate. BACK:  Non-tender to palpation.  No CVAT SKIN:  Warm, dry and intact.    Results: U/A:  0-5 WBC, 0-2 RBC, mod bacteria  PVR:  15 ml

## 2023-04-16 ENCOUNTER — Ambulatory Visit: Payer: Self-pay | Admitting: Urology

## 2023-06-03 ENCOUNTER — Telehealth: Payer: Self-pay | Admitting: Family Medicine

## 2023-06-03 DIAGNOSIS — J029 Acute pharyngitis, unspecified: Secondary | ICD-10-CM

## 2023-06-03 MED ORDER — AMOXICILLIN 500 MG PO CAPS: 500.0000 mg | ORAL_CAPSULE | Freq: Two times a day (BID) | ORAL | 0 refills | Status: AC

## 2023-06-03 NOTE — Progress Notes (Signed)
E-Visit for Sore Throat - Strep Symptoms ? ?We are sorry that you are not feeling well.  Here is how we plan to help! ? ?Based on what you have shared with me it is likely that you have strep pharyngitis.  Strep pharyngitis is inflammation and infection in the back of the throat.  This is an infection cause by bacteria and is treated with antibiotics.  I have prescribed Amoxicillin 500 mg twice a day for 10 days. For throat pain, we recommend over the counter oral pain relief medications such as acetaminophen or aspirin, or anti-inflammatory medications such as ibuprofen or naproxen sodium. Topical treatments such as oral throat lozenges or sprays may be used as needed. Strep infections are not as easily transmitted as other respiratory infections, however we still recommend that you avoid close contact with loved ones, especially the very young and elderly.  Remember to wash your hands thoroughly throughout the day as this is the number one way to prevent the spread of infection and wipe down door knobs and counters with disinfectant. ? ? ?Home Care: ?Only take medications as instructed by your medical team. ?Complete the entire course of an antibiotic. ?Do not take these medications with alcohol. ?A steam or ultrasonic humidifier can help congestion.  You can place a towel over your head and breathe in the steam from hot water coming from a faucet. ?Avoid close contacts especially the very young and the elderly. ?Cover your mouth when you cough or sneeze. ?Always remember to wash your hands. ? ?Get Help Right Away If: ?You develop worsening fever or sinus pain. ?You develop a severe head ache or visual changes. ?Your symptoms persist after you have completed your treatment plan. ? ?Make sure you ?Understand these instructions. ?Will watch your condition. ?Will get help right away if you are not doing well or get worse. ? ? ?Thank you for choosing an e-visit. ? ?Your e-visit answers were reviewed by a board  certified advanced clinical practitioner to complete your personal care plan. Depending upon the condition, your plan could have included both over the counter or prescription medications. ? ?Please review your pharmacy choice. Make sure the pharmacy is open so you can pick up prescription now. If there is a problem, you may contact your provider through [employer] and have the prescription routed to another pharmacy.  Your safety is important to [place]. If you have drug allergies check your prescription carefully.  ? ?For the next 24 hours you can use MyChart to ask questions about today's visit, request a non-urgent call back, or ask for a work or school excuse. ?You will get an email in the next two days asking about your experience. I hope that your e-visit has been valuable and will speed your recovery. ? ?Approximately 5 minutes was spent documenting and reviewing patient's chart.  ? ? ?

## 2023-08-12 ENCOUNTER — Other Ambulatory Visit: Payer: Self-pay | Admitting: Family Medicine

## 2023-08-13 NOTE — Telephone Encounter (Signed)
Pls contact the patient to schedule 25-month HTN follow-up with Dr. Ashley Royalty.  Sending 30 day med refill. No additional. Thanks

## 2023-08-13 NOTE — Telephone Encounter (Signed)
Patient scheduled for 12/16, thanks.

## 2023-08-27 ENCOUNTER — Ambulatory Visit: Payer: Self-pay | Admitting: Family Medicine

## 2023-08-30 ENCOUNTER — Ambulatory Visit: Payer: Self-pay | Admitting: Family Medicine

## 2023-08-30 ENCOUNTER — Encounter: Payer: Self-pay | Admitting: Family Medicine

## 2023-08-30 VITALS — BP 130/78 | HR 73 | Ht 63.5 in | Wt 187.0 lb

## 2023-08-30 DIAGNOSIS — I10 Essential (primary) hypertension: Secondary | ICD-10-CM

## 2023-08-30 MED ORDER — LISINOPRIL 10 MG PO TABS: 10.0000 mg | ORAL_TABLET | Freq: Every day | ORAL | 1 refills | Status: DC

## 2023-08-30 NOTE — Progress Notes (Signed)
Diana Fischer - 37 y.o. female MRN [id number]  Date of birth: 08-Apr-1986  Subjective Chief Complaint  Patient presents with   Hypertension    HPI Diana Fischer is a 37 y.o. female here today for follow-up visit.  He reports she is doing well.  She continues on lisinopril 10 mg daily.  Blood pressure is well-controlled at current strength.  She denies side effects at this time.  She has not had chest pain, shortness of breath, palpitations, headaches or vision changes.  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   Left knee 07/16/18    Dizzy spells    during pregnancy   Gestational hypertension    Hemorrhoids    HTN (hypertension)    Kidney stone    Melanoma (HCC)    Pre-eclampsia 11/13/2013   UTI (urinary tract infection)     Past Surgical History:  Procedure Laterality Date   MOHS SURGERY     NO PAST SURGERIES     WISDOM TOOTH EXTRACTION      Social History   Socioeconomic History   Marital status: Married    Spouse name: Not on file   Number of children: 3   Years of education: Not on file   Highest education level: Associate degree: occupational, Scientist, product/process development, or vocational program  Occupational History   Not on file  Tobacco Use   Smoking status: Never   Smokeless tobacco: Never  Vaping Use   Vaping status: Never Used  Substance and Sexual Activity   Alcohol use: No   Drug use: No   Sexual activity: Yes    Birth control/protection: None    Comment: spouse - vasectomy  Other Topics Concern   Not on file  Social History Narrative   Not on file   Social Drivers of Health   Financial Resource Strain: Medium Risk (08/26/2023)   Overall Financial Resource Strain (CARDIA)    Difficulty of Paying Living Expenses: Somewhat hard  Food Insecurity: No Food Insecurity (08/26/2023)    Hunger Vital Sign    Worried About Running Out of Food in the Last Year: Never true    Ran Out of Food in the Last Year: Never true  Transportation Needs: No Transportation Needs (08/26/2023)   PRAPARE - Administrator, Civil Service (Medical): No    Lack of Transportation (Non-Medical): No  Physical Activity: Insufficiently Active (08/26/2023)   Exercise Vital Sign    Days of Exercise per Week: 3 days    Minutes of Exercise per Session: 30 min  Stress: No Stress Concern Present (08/26/2023)   [employer] of Occupational Health - Occupational Stress Questionnaire    Feeling of Stress : Not at all  Social Connections: Socially Integrated (08/26/2023)   Social Connection and Isolation Panel [NHANES]    Frequency of Communication with Friends and Family: More than three times a week    Frequency of Social Gatherings with Friends and Family: Once a week    Attends Religious Services: More than 4 times per year    Active Member of [employer] or Organizations: Yes    Attends Engineer, structural: More than 4 times per year    Marital Status: Married    Family History  Problem Relation Age of Onset   Hypertension Mother  Hyperlipidemia Mother    Hyperlipidemia Father    Hypertension Father    Colon polyps Father    Irritable bowel syndrome Father    Heart attack Maternal Grandfather    Heart disease Maternal Grandfather    Cancer Paternal Grandfather        leukemia   Uterine cancer Maternal Grandmother    Colon cancer Neg Hx    Esophageal cancer Neg Hx    Stomach cancer Neg Hx    Rectal cancer Neg Hx     Health Maintenance  Topic Date Due   Hepatitis C Screening  Never done   COVID-19 Vaccine (3 - 2024-25 season) 11/28/2023 (Originally 05/13/2023)   INFLUENZA VACCINE  12/10/2023 (Originally 04/12/2023)   Cervical Cancer Screening (HPV/Pap Cotest)  07/26/2026   DTaP/Tdap/Td (2 - Td or Tdap) 12/25/2027   HIV Screening  Completed   HPV VACCINES  Aged Out      ----------------------------------------------------------------------------------------------------------------------------------------------------------------------------------------------------------------- Physical Exam BP 130/78 (BP Location: Left Arm, Patient Position: Sitting, Cuff Size: Normal)   Pulse 73   Ht 5' 3.5" (1.613 m)   Wt 187 lb (84.8 kg)   SpO2 98%   BMI 32.61 kg/m   Physical Exam HENT:     Head: Normocephalic and atraumatic.  Eyes:     General: No scleral icterus. Cardiovascular:     Rate and Rhythm: Normal rate and regular rhythm.  Pulmonary:     Effort: Pulmonary effort is normal.     Breath sounds: Normal breath sounds.  Musculoskeletal:     Cervical back: Neck supple.  Neurological:     Mental Status: She is alert.  Psychiatric:        Mood and Affect: Mood normal.        Behavior: Behavior normal.     ------------------------------------------------------------------------------------------------------------------------------------------------------------------------------------------------------------------- Assessment and Plan  HTN (hypertension) Blood pressure is well-controlled at this time.  Will plan to continue lisinopril at current strength.  Follow-up in 6 months.  Repeat labs at next visit.   Meds ordered this encounter  Medications   lisinopril (ZESTRIL) 10 MG tablet    Sig: Take 1 tablet (10 mg total) by mouth daily.    Dispense:  90 tablet    Refill:  1    Return in about 6 months (around 02/28/2024) for Annual Exam, Hypertension,fasting labs.    This visit occurred during the SARS-CoV-2 public health emergency.  Safety protocols were in place, including screening questions prior to the visit, additional usage of staff PPE, and extensive cleaning of exam room while observing appropriate contact time as indicated for disinfecting solutions.

## 2023-08-30 NOTE — Assessment & Plan Note (Signed)
Blood pressure is well-controlled at this time.  Will plan to continue lisinopril at current strength.  Follow-up in 6 months.  Repeat labs at next visit.

## 2023-09-26 ENCOUNTER — Telehealth: Payer: Self-pay | Admitting: Physician Assistant

## 2023-09-26 DIAGNOSIS — K120 Recurrent oral aphthae: Secondary | ICD-10-CM

## 2023-09-26 MED ORDER — TRIAMCINOLONE ACETONIDE 0.1 % MT PSTE
1.0000 | PASTE | Freq: Two times a day (BID) | OROMUCOSAL | 0 refills | Status: DC
Start: 2023-09-26 — End: 2024-04-04

## 2023-09-26 NOTE — Progress Notes (Signed)
 E-Visit for Mouth Ulcers  We are sorry that you are not feeling well.  Here is how we plan to help!  Based on what you have shared with me, it appears that you do have mouth ulcer(s).     The following medications should decrease the discomfort and help with healing. Biotene mouthwash 3 times daily (Available over the counter) and Triamcinolone  Dental Paste apply 3 times daily as needed for up to 3 days   Mouth ulcers are painful areas in the mouth and gums. These are also known as "canker sores".  They can occur anywhere inside the mouth. While mostly harmless, mouth ulcers can be extremely uncomfortable and may make it difficult to eat, drink, and brush your teeth.  You may have more than 1 ulcer and they can vary and change in size. Mouth ulcers are not contagious and should not be confused with cold sores.  Cold sores appear on the lip or around the outside of the mouth and often begin with a tingling, burning or itching sensation.   While the exact causes are unknown, some common causes and factors that may aggravate mouth ulcers include: Genetics - Sometimes mouth ulcers run in families High alcohol intake Acidic foods such as citrus fruits like pineapple, grapefruit, orange fruits/juices, may aggravate mouth ulcers Other foods high in acidity or spice such as coffee, chocolate, chips, pretzels, eggs, nuts, cheese Quitting smoking Injury caused by biting the tongue or inside of the cheek Diet lacking in B-12, zinc, folic acid or iron Female hormone shifts with menstruation Excessive fatigue, emotional stress or anxiety Prevention: Talk to your doctor if you are taking meds that are known to cause mouth ulcers such as:   Anti-inflammatory drugs (for example Ibuprofen , Naproxen sodium), pain killers, Beta blockers, Oral nicotine replacement drugs, Some street drugs (heroin).   Avoid allowing any tablets to dissolve in your mouth that are meant to swallowed whole Avoid foods/drinks that  trigger or worsen symptoms Keep your mouth clean with daily brushing and flossing  Home Care: The goal with treatment is to ease the pain where ulcers occur and help them heal as quickly as possible.  There is no medical treatment to prevent mouth ulcers from coming back or recurring.  Avoid spicy and acidic foods Eat soft foods and avoid rough, crunchy foods Avoid chewing gum Do not use toothpaste that contains sodium lauryl sulphite Use a straw to drink which helps avoid liquids toughing the ulcers near the front of your mouth Use a very soft toothbrush If you have dentures or dental hardware that you feel is not fitting well or contributing to his, please see your dentist. Use saltwater mouthwash which helps healing. Dissolve a  teaspoon of salt in a glass of warm water. Swish around your mouth and spit it out. This can be used as needed if it is soothing.   GET HELP RIGHT AWAY IF: Persistent ulcers require checking IN PERSON (face to face). Any mouth lesion lasting longer than a month should be seen by your DENTIST as soon as possible for evaluation for possible oral cancer. If you have a non-painful ulcer in 1 or more areas of your mouth Ulcers that are spreading, are very large or particularly painful Ulcers last longer than one week without improving on treatment If you develop a fever, swollen glands and begin to feel unwell Ulcers that developed after starting a new medication MAKE SURE YOU: Understand these instructions. Will watch your condition. Will get help right  away if you are not doing well or get worse.  Thank you for choosing an e-visit.  Your e-visit answers were reviewed by a board certified advanced clinical practitioner to complete your personal care plan. Depending upon the condition, your plan could have included both over the counter or prescription medications.  Please review your pharmacy choice. Make sure the pharmacy is open so you can pick up prescription  now. If there is a problem, you may contact your provider through [employer] and have the prescription routed to another pharmacy.  Your safety is important to us . If you have drug allergies check your prescription carefully.   For the next 24 hours you can use MyChart to ask questions about today's visit, request a non-urgent call back, or ask for a work or school excuse. You will get an email in the next two days asking about your experience. I hope that your e-visit has been valuable and will speed your recovery.

## 2023-09-26 NOTE — Progress Notes (Signed)
 I have spent 5 minutes in review of e-visit questionnaire, review and updating patient chart, medical decision making and response to patient.   Piedad Climes, PA-C

## 2023-10-15 ENCOUNTER — Telehealth: Payer: Self-pay | Admitting: Nurse Practitioner

## 2023-10-15 DIAGNOSIS — B370 Candidal stomatitis: Secondary | ICD-10-CM

## 2023-10-15 MED ORDER — NYSTATIN 100000 UNIT/ML MT SUSP
5.0000 mL | Freq: Four times a day (QID) | OROMUCOSAL | 0 refills | Status: DC
Start: 2023-10-15 — End: 2024-04-04

## 2023-10-15 NOTE — Progress Notes (Signed)
E-Visit for [name]  We are sorry that you are not feeling well.  Here is how we plan to help!  Meds ordered this encounter  Medications   nystatin (MYCOSTATIN) 100000 UNIT/ML suspension    Sig: Take 5 mLs (500,000 Units total) by mouth 4 (four) times daily.    Dispense:  473 mL    Refill:  0     Things to avoid to help resolution of symptoms:  Genetics - Sometimes mouth ulcers run in families High alcohol intake Acidic foods such as citrus fruits like pineapple, grapefruit, orange fruits/juices, may aggravate mouth ulcers Other foods high in acidity or spice such as coffee, chocolate, chips, pretzels, eggs, nuts, cheese Quitting smoking Injury caused by biting the tongue or inside of the cheek Diet lacking in B-12, zinc, folic acid or iron Female hormone shifts with menstruation Excessive fatigue, emotional stress or anxiety Prevention: Talk to your doctor if you are taking meds that are known to cause mouth ulcers such as:   Anti-inflammatory drugs (for example Ibuprofen, Naproxen sodium), pain killers, Beta blockers, Oral nicotine replacement drugs, Some street drugs (heroin).   Avoid allowing any tablets to dissolve in your mouth that are meant to swallowed whole Avoid foods/drinks that trigger or worsen symptoms Keep your mouth clean with daily brushing and flossing  Home Care: The goal with treatment is to ease the pain where ulcers occur and help them heal as quickly as possible.  There is no medical treatment to prevent mouth ulcers from coming back or recurring.  Avoid spicy and acidic foods Eat soft foods and avoid rough, crunchy foods Avoid chewing gum Do not use toothpaste that contains sodium lauryl sulphite Use a straw to drink which helps avoid liquids toughing the ulcers near the front of your mouth Use a very soft toothbrush If you have dentures or dental hardware that you feel is not fitting well or contributing to his, please see your dentist. Use  saltwater mouthwash which helps healing. Dissolve a  teaspoon of salt in a glass of warm water. Swish around your mouth and spit it out. This can be used as needed if it is soothing.   GET HELP RIGHT AWAY IF: Persistent ulcers require checking IN PERSON (face to face). Any mouth lesion lasting longer than a month should be seen by your DENTIST as soon as possible for evaluation for possible oral cancer. If you have a non-painful ulcer in 1 or more areas of your mouth Ulcers that are spreading, are very large or particularly painful Ulcers last longer than one week without improving on treatment If you develop a fever, swollen glands and begin to feel unwell Ulcers that developed after starting a new medication MAKE SURE YOU: Understand these instructions. Will watch your condition. Will get help right away if you are not doing well or get worse.  Thank you for choosing an e-visit.  Your e-visit answers were reviewed by a board certified advanced clinical practitioner to complete your personal care plan. Depending upon the condition, your plan could have included both over the counter or prescription medications.  Please review your pharmacy choice. Make sure the pharmacy is open so you can pick up prescription now. If there is a problem, you may contact your provider through [employer] and have the prescription routed to another pharmacy.  Your safety is important to [place]. If you have drug allergies check your prescription carefully.   For the next 24 hours you can use MyChart to  ask questions about today's visit, request a non-urgent call back, or ask for a work or school excuse. You will get an email in the next two days asking about your experience. I hope that your e-visit has been valuable and will speed your recovery.   I spent approximately 5 minutes reviewing the patient's history, current symptoms and coordinating their care today.

## 2024-02-28 ENCOUNTER — Ambulatory Visit: Payer: Self-pay | Admitting: Family Medicine

## 2024-03-10 ENCOUNTER — Other Ambulatory Visit: Payer: Self-pay | Admitting: Family Medicine

## 2024-03-10 DIAGNOSIS — I10 Essential (primary) hypertension: Secondary | ICD-10-CM

## 2024-03-11 NOTE — Telephone Encounter (Signed)
 Scheduled

## 2024-03-11 NOTE — Telephone Encounter (Signed)
 Pls contact the pt for HTN appt with Dr Alvia. Sending 30 day refill.

## 2024-04-04 ENCOUNTER — Ambulatory Visit (INDEPENDENT_AMBULATORY_CARE_PROVIDER_SITE_OTHER): Payer: Self-pay | Admitting: Family Medicine

## 2024-04-04 VITALS — BP 122/78 | HR 73 | Ht 63.5 in | Wt 190.0 lb

## 2024-04-04 DIAGNOSIS — I10 Essential (primary) hypertension: Secondary | ICD-10-CM

## 2024-04-04 DIAGNOSIS — Z13220 Encounter for screening for lipoid disorders: Secondary | ICD-10-CM

## 2024-04-04 NOTE — Assessment & Plan Note (Signed)
 Blood pressure is well-controlled at this time.  Will plan to continue lisinopril  at current strength.  Follow-up in 6 months.  Update labs today.

## 2024-04-04 NOTE — Progress Notes (Signed)
 Diana Fischer - 38 y.o. female MRN [id number]  Date of birth: May 06, 1986  Subjective Chief Complaint  Patient presents with   Hypertension    HPI Diana Fischer is a 38 y.o. female here today for follow up visit.   She reports that she is doing well.   She remains on lisinopril  for management of HTN.  BP is well controlled.  She denies side effects from medication at current strength.  She has not had chest pain, shortness of breath, palpitations, headache or vision changes.   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   Left knee 07/16/18    Dizzy spells    during pregnancy   Gestational hypertension    Hemorrhoids    HTN (hypertension)    Kidney stone    Melanoma (HCC)    Pre-eclampsia 11/13/2013   UTI (urinary tract infection)     Past Surgical History:  Procedure Laterality Date   MOHS SURGERY     NO PAST SURGERIES     WISDOM TOOTH EXTRACTION      Social History   Socioeconomic History   Marital status: Married    Spouse name: Not on file   Number of children: 3   Years of education: Not on file   Highest education level: Associate degree: occupational, Scientist, product/process development, or vocational program  Occupational History   Not on file  Tobacco Use   Smoking status: Never   Smokeless tobacco: Never  Vaping Use   Vaping status: Never Used  Substance and Sexual Activity   Alcohol use: No   Drug use: No   Sexual activity: Yes    Birth control/protection: None    Comment: spouse - vasectomy  Other Topics Concern   Not on file  Social History Narrative   Not on file   Social Drivers of Health   Financial Resource Strain: Low Risk  (04/04/2024)   Overall Financial Resource Strain (CARDIA)    Difficulty of Paying Living Expenses: Not very hard  Food Insecurity: No Food Insecurity (04/04/2024)    Hunger Vital Sign    Worried About Running Out of Food in the Last Year: Never true    Ran Out of Food in the Last Year: Never true  Transportation Needs: No Transportation Needs (04/04/2024)   PRAPARE - Administrator, Civil Service (Medical): No    Lack of Transportation (Non-Medical): No  Physical Activity: Sufficiently Active (04/04/2024)   Exercise Vital Sign    Days of Exercise per Week: 5 days    Minutes of Exercise per Session: 30 min  Stress: No Stress Concern Present (04/04/2024)   [employer] of Occupational Health - Occupational Stress Questionnaire    Feeling of Stress: Only a little  Social Connections: Socially Integrated (04/04/2024)   Social Connection and Isolation Panel    Frequency of Communication with Friends and Family: More than three times a week    Frequency of Social Gatherings with Friends and Family: Once a week    Attends Religious Services: More than 4 times per year    Active Member of [employer] or Organizations: Yes    Attends Engineer, structural: More than 4 times per year    Marital Status: Married    Family History  Problem Relation Age of Onset  Hypertension Mother    Hyperlipidemia Mother    Hyperlipidemia Father    Hypertension Father    Colon polyps Father    Irritable bowel syndrome Father    Heart attack Maternal Grandfather    Heart disease Maternal Grandfather    Cancer Paternal Grandfather        leukemia   Uterine cancer Maternal Grandmother    Colon cancer Neg Hx    Esophageal cancer Neg Hx    Stomach cancer Neg Hx    Rectal cancer Neg Hx     Health Maintenance  Topic Date Due   Hepatitis C Screening  Never done   Hepatitis B Vaccines (1 of 3 - 19+ 3-dose series) Never done   HPV VACCINES (1 - 3-dose SCDM series) Never done   COVID-19 Vaccine (3 - 2024-25 season) 05/13/2023   INFLUENZA VACCINE  04/11/2024   Cervical Cancer Screening (HPV/Pap Cotest)  07/26/2026   DTaP/Tdap/Td (2 - Td or Tdap)  12/25/2027   HIV Screening  Completed   Meningococcal B Vaccine  Aged Out     ----------------------------------------------------------------------------------------------------------------------------------------------------------------------------------------------------------------- Physical Exam BP 122/78 (BP Location: Left Arm, Patient Position: Sitting, Cuff Size: Normal)   Pulse 73   Ht 5' 3.5 (1.613 m)   Wt 190 lb (86.2 kg)   SpO2 97%   BMI 33.13 kg/m   Physical Exam Constitutional:      Appearance: Normal appearance.  HENT:     Head: Normocephalic and atraumatic.  Eyes:     General: No scleral icterus. Cardiovascular:     Rate and Rhythm: Normal rate and regular rhythm.  Pulmonary:     Effort: Pulmonary effort is normal.     Breath sounds: Normal breath sounds.  Musculoskeletal:     Cervical back: Neck supple.  Neurological:     General: No focal deficit present.     Mental Status: She is alert.  Psychiatric:        Mood and Affect: Mood normal.        Behavior: Behavior normal.     ------------------------------------------------------------------------------------------------------------------------------------------------------------------------------------------------------------------- Assessment and Plan  HTN (hypertension) Blood pressure is well-controlled at this time.  Will plan to continue lisinopril  at current strength.  Follow-up in 6 months.  Update labs today.    No orders of the defined types were placed in this encounter.   No follow-ups on file.

## 2024-04-05 LAB — CMP14+EGFR
ALT: 18 IU/L (ref 0–32)
AST: 20 IU/L (ref 0–40)
Albumin: 4.6 g/dL (ref 3.9–4.9)
Alkaline Phosphatase: 95 IU/L (ref 44–121)
BUN/Creatinine Ratio: 9 (ref 9–23)
BUN: 6 mg/dL (ref 6–20)
Bilirubin Total: 0.4 mg/dL (ref 0.0–1.2)
CO2: 19 mmol/L — ABNORMAL LOW (ref 20–29)
Calcium: 9.8 mg/dL (ref 8.7–10.2)
Chloride: 103 mmol/L (ref 96–106)
Creatinine, Ser: 0.69 mg/dL (ref 0.57–1.00)
Globulin, Total: 3 g/dL (ref 1.5–4.5)
Glucose: 87 mg/dL (ref 70–99)
Potassium: 4.6 mmol/L (ref 3.5–5.2)
Sodium: 139 mmol/L (ref 134–144)
Total Protein: 7.6 g/dL (ref 6.0–8.5)
eGFR: 113 mL/min/1.73 (ref >59)

## 2024-04-05 LAB — CBC WITH DIFFERENTIAL/PLATELET
Basophils Absolute: 0 x10E3/uL (ref 0.0–0.2)
Basos: 1 %
EOS (ABSOLUTE): 0.1 x10E3/uL (ref 0.0–0.4)
Eos: 1 %
Hematocrit: 43.4 % (ref 34.0–46.6)
Hemoglobin: 14.2 g/dL (ref 11.1–15.9)
Immature Grans (Abs): 0 x10E3/uL (ref 0.0–0.1)
Immature Granulocytes: 0 %
Lymphocytes Absolute: 2.3 x10E3/uL (ref 0.7–3.1)
Lymphs: 33 %
MCH: 30.9 pg (ref 26.6–33.0)
MCHC: 32.7 g/dL (ref 31.5–35.7)
MCV: 94 fL (ref 79–97)
Monocytes Absolute: 0.5 x10E3/uL (ref 0.1–0.9)
Monocytes: 7 %
Neutrophils Absolute: 4 x10E3/uL (ref 1.4–7.0)
Neutrophils: 57 %
Platelets: 370 x10E3/uL (ref 150–450)
RBC: 4.6 x10E6/uL (ref 3.77–5.28)
RDW: 11.9 % (ref 11.7–15.4)
WBC: 7 x10E3/uL (ref 3.4–10.8)

## 2024-04-05 LAB — LIPID PANEL WITH LDL/HDL RATIO
Cholesterol, Total: 207 mg/dL — ABNORMAL HIGH (ref 100–199)
HDL: 54 mg/dL (ref >39)
LDL Chol Calc (NIH): 135 mg/dL — ABNORMAL HIGH (ref 0–99)
LDL/HDL Ratio: 2.5 ratio (ref 0.0–3.2)
Triglycerides: 103 mg/dL (ref 0–149)
VLDL Cholesterol Cal: 18 mg/dL (ref 5–40)

## 2024-04-05 LAB — TSH: TSH: 1.89 u[IU]/mL (ref 0.450–4.500)

## 2024-04-09 ENCOUNTER — Other Ambulatory Visit: Payer: Self-pay | Admitting: Family Medicine

## 2024-04-09 DIAGNOSIS — I10 Essential (primary) hypertension: Secondary | ICD-10-CM

## 2024-04-10 ENCOUNTER — Ambulatory Visit: Payer: Self-pay | Admitting: Family Medicine

## 2024-04-10 NOTE — Progress Notes (Signed)
 Hi Aramis, covering for Dr. Alvia while he is out of the office.  Your metabolic panel overall looks good.  LDL and total cholesterol are mildly elevated continue to work on [employer] and shooting for 20 minutes of moderate intensity exercise 5 days a week to improve those numbers and reduce your cardiovascular risk.  Your blood count is normal no sign of anemia.  Thyroid  looks great.

## 2024-05-13 ENCOUNTER — Encounter: Payer: Self-pay | Admitting: Sports Medicine

## 2024-06-22 ENCOUNTER — Telehealth: Payer: Self-pay | Admitting: Physician Assistant

## 2024-06-22 DIAGNOSIS — R197 Diarrhea, unspecified: Secondary | ICD-10-CM

## 2024-06-22 MED ORDER — DICYCLOMINE HCL 20 MG PO TABS: 20.0000 mg | ORAL_TABLET | Freq: Four times a day (QID) | ORAL | 0 refills | Status: DC

## 2024-06-22 NOTE — Progress Notes (Signed)
 We are sorry that you are not feeling well.  Here is how we plan to help!  Based on what you have shared with me it looks like you have Acute Infectious Diarrhea.  Most cases of acute diarrhea are due to infections with virus and bacteria and are self-limited conditions lasting less than 14 days.  For your symptoms you may take Imodium 2 mg tablets that are over the counter at your local pharmacy. Take two tablet now and then one after each loose stool up to 6 a day.  Antibiotics are not needed for most people with diarrhea.  I have sent in Dicyclomine.  Can take 1 tablet 4 times per day.  Please make a follow up appointment with your primary care physician or with GI for further refills and to discuss if this is helpful.   HOME CARE We recommend changing your diet to help with your symptoms for the next few days. Drink plenty of fluids that contain water salt and sugar. Sports drinks such as Gatorade may help.  You may try broths, soups, bananas, applesauce, soft breads, mashed potatoes or crackers.  You are considered infectious for as long as the diarrhea continues. Hand washing or use of alcohol based hand sanitizers is recommend. It is best to stay out of work or school until your symptoms stop.   GET HELP RIGHT AWAY If you have dark yellow colored urine or do not pass urine frequently you should drink more fluids.   If your symptoms worsen  If you feel like you are going to pass out (faint) You have a new problem  MAKE SURE YOU  Understand these instructions. Will watch your condition. Will get help right away if you are not doing well or get worse.  Your e-visit answers were reviewed by a board certified advanced clinical practitioner to complete your personal care plan.  Depending on the condition, your plan could have included both over the counter or prescription medications.  If there is a problem please reply  once you have received a response from your provider.  Your  safety is important to us .  If you have drug allergies check your prescription carefully.    You can use MyChart to ask questions about today's visit, request a non-urgent call back, or ask for a work or school excuse for 24 hours related to this e-Visit. If it has been greater than 24 hours you will need to follow up with your provider, or enter a new e-Visit to address those concerns.   You will get an e-mail in the next two days asking about your experience.  I hope that your e-visit has been valuable and will speed your recovery. Thank you for using e-visits.   I have spent 5 minutes in review of e-visit questionnaire, review and updating patient chart, medical decision making and response to patient.   Harlene PEDLAR Ward, PA-C

## 2024-06-25 ENCOUNTER — Ambulatory Visit: Payer: Self-pay

## 2024-06-25 NOTE — Telephone Encounter (Signed)
 FYI Only or Action Required?: FYI only for provider.  Patient was last seen in primary care on 04/04/2024 by Alvia Bring, DO.  Called Nurse Triage reporting Diarrhea.  Symptoms began several weeks ago.  Interventions attempted: Prescription medications: imodium and dicyclomine.  Symptoms are: gradually worsening.  Triage Disposition: See Physician Within 24 Hours  Patient/caregiver understands and will follow disposition?:   Reason for Disposition  [1] SEVERE diarrhea (e.g., 7 or more times / day more than normal) AND [2] present > 24 hours (1 day)  Answer Assessment - Initial Assessment Questions Patient reports 2 week hx of diarrhea. Reports consistent lower right back pain that began yesterday, denies dysuria. Denies hematochezia or melena.   Had telehealth visit 06/22/24 for same. Patient states diarrhea initially improved with imodium and dicyclomine but today, diarrhea and abdominal pain returned. States she has been having diarrhea for approx 2 weeks now.  States is tolerating water and sprite, reports no change in urine color. ED precautions reviewed, pt verbalized understanding.   1. DIARRHEA SEVERITY:      Severe  2. ONSET: When did the diarrhea begin?      2 weeks  3. STOOL DESCRIPTION:  How loose or watery is the diarrhea? What is the stool color? Is there any blood or mucous in the stool?     Brown to yellow  4. VOMITING: Are you also vomiting? If Yes, ask: How many times in the past 24 hours?      Has nausea, no vomiting.  5. ABDOMEN PAIN: Are you having any abdomen pain? If Yes, ask: What does it feel like? (e.g., crampy, dull, intermittent, constant)      Intermittent abdominal cramping prior to having to pass stool   7. ORAL INTAKE: If vomiting, Have you been able to drink liquids? How much liquids have you had in the past 24 hours?     Tolerating liquids  8. HYDRATION: Any signs of dehydration? (e.g., dry mouth [not just dry  lips], too weak to stand, dizziness, new weight loss) When did you last urinate?     Denies  9. EXPOSURE: Have you traveled to a foreign country recently? Have you been exposed to anyone with diarrhea? Could you have eaten any food that was spoiled?     Denies  10. ANTIBIOTIC USE: Are you taking antibiotics now or have you taken antibiotics in the past 2 months?       Denies  11. OTHER SYMPTOMS: Do you have any other symptoms? (e.g., fever, blood in stool)       Denies  Protocols used: [facility]   Message from [place] sent at 06/25/2024  3:31 PM EDT  Summary: Diarrhea, seeking assistance   Reason for Triage: Diarrhea, unable to eat or drink. Seeking relief.  Best contact: [phone]

## 2024-06-26 ENCOUNTER — Ambulatory Visit: Payer: Self-pay | Admitting: Urgent Care

## 2024-07-08 ENCOUNTER — Inpatient Hospital Stay: Payer: Self-pay | Admitting: Family Medicine

## 2024-07-25 ENCOUNTER — Ambulatory Visit: Payer: Self-pay | Admitting: Family Medicine

## 2024-07-25 ENCOUNTER — Ambulatory Visit: Payer: Self-pay

## 2024-07-25 VITALS — BP 125/86 | HR 90 | Ht 63.5 in | Wt 186.0 lb

## 2024-07-25 DIAGNOSIS — M545 Low back pain, unspecified: Secondary | ICD-10-CM

## 2024-07-25 DIAGNOSIS — K5900 Constipation, unspecified: Secondary | ICD-10-CM

## 2024-07-25 DIAGNOSIS — R1084 Generalized abdominal pain: Secondary | ICD-10-CM

## 2024-07-26 LAB — CBC WITH DIFFERENTIAL/PLATELET
Basophils Absolute: 0.1 x10E3/uL (ref 0.0–0.2)
Basos: 1 %
EOS (ABSOLUTE): 0.1 x10E3/uL (ref 0.0–0.4)
Eos: 1 %
Hematocrit: 42.8 % (ref 34.0–46.6)
Hemoglobin: 13.6 g/dL (ref 11.1–15.9)
Immature Grans (Abs): 0.1 x10E3/uL (ref 0.0–0.1)
Immature Granulocytes: 1 %
Lymphocytes Absolute: 2.3 x10E3/uL (ref 0.7–3.1)
Lymphs: 23 %
MCH: 29.9 pg (ref 26.6–33.0)
MCHC: 31.8 g/dL (ref 31.5–35.7)
MCV: 94 fL (ref 79–97)
Monocytes Absolute: 0.6 x10E3/uL (ref 0.1–0.9)
Monocytes: 7 %
Neutrophils Absolute: 6.5 x10E3/uL (ref 1.4–7.0)
Neutrophils: 67 %
Platelets: 390 x10E3/uL (ref 150–450)
RBC: 4.55 x10E6/uL (ref 3.77–5.28)
RDW: 11.7 % (ref 11.7–15.4)
WBC: 9.7 x10E3/uL (ref 3.4–10.8)

## 2024-07-26 LAB — CMP14+EGFR
ALT: 17 IU/L (ref 0–32)
AST: 17 IU/L (ref 0–40)
Albumin: 4.4 g/dL (ref 3.9–4.9)
Alkaline Phosphatase: 103 IU/L (ref 41–116)
BUN/Creatinine Ratio: 14 (ref 9–23)
BUN: 9 mg/dL (ref 6–20)
Bilirubin Total: 0.3 mg/dL (ref 0.0–1.2)
CO2: 20 mmol/L (ref 20–29)
Calcium: 9.2 mg/dL (ref 8.7–10.2)
Chloride: 104 mmol/L (ref 96–106)
Creatinine, Ser: 0.66 mg/dL (ref 0.57–1.00)
Globulin, Total: 2.7 g/dL (ref 1.5–4.5)
Glucose: 97 mg/dL (ref 70–99)
Potassium: 4.6 mmol/L (ref 3.5–5.2)
Sodium: 138 mmol/L (ref 134–144)
Total Protein: 7.1 g/dL (ref 6.0–8.5)
eGFR: 114 mL/min/1.73 (ref >59)

## 2024-07-26 LAB — SEDIMENTATION RATE: Sed Rate: 23 mm/h (ref 0–32)

## 2024-07-26 LAB — TSH: TSH: 1.67 u[IU]/mL (ref 0.450–4.500)

## 2024-07-26 LAB — C-REACTIVE PROTEIN: CRP: 2 mg/L (ref 0–10)

## 2024-07-28 DIAGNOSIS — R1084 Generalized abdominal pain: Secondary | ICD-10-CM | POA: Insufficient documentation

## 2024-07-28 DIAGNOSIS — M545 Low back pain, unspecified: Secondary | ICD-10-CM | POA: Insufficient documentation

## 2024-07-28 DIAGNOSIS — K5900 Constipation, unspecified: Secondary | ICD-10-CM | POA: Insufficient documentation

## 2024-07-28 NOTE — Assessment & Plan Note (Signed)
 Previously with diarrhea now with constipation.  Concern for possible inflammatory bowel disease on previous CT.  Rechecking labs.  Checking calprotectin levels.  She will continue ClearLax for now.

## 2024-07-28 NOTE — Assessment & Plan Note (Signed)
 Abdominal pain overall is improved.  Updated KUB ordered as she is having continued constipation.

## 2024-07-28 NOTE — Progress Notes (Signed)
 Diana Fischer - 38 y.o. female MRN [id number]  Date of birth: 09-Jun-1986  Subjective Chief Complaint  Patient presents with   Constipation    HPI Diana Fischer is a 38 year old female here today for follow-up of recent hospital visit.  She had a visit a few weeks ago for diarrhea.  Seen in the ED few days later with continued symptoms.  CT of the abdomen and pelvis with contrast that showed mildly thickened distal ileum.  Correlate for inflammatory bowel disease.  Ultrasound of the gallbladder normal.  Treated with azithromycin  and probiotic.  Diarrhea has resolved.  She is not having more constipation.  Previously was having 1 bowel movement per day that was a few days between bowel movements.  She has tried an enema as well as this ClearLax.  She is also having some lower back pain.  Denies urinary symptoms.  Denies fever or chills.  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   Left knee 07/16/18    Dizzy spells    during pregnancy   Gestational hypertension    Hemorrhoids    HTN (hypertension)    Kidney stone    Melanoma (HCC)    Pre-eclampsia 11/13/2013   UTI (urinary tract infection)     Past Surgical History:  Procedure Laterality Date   MOHS SURGERY     NO PAST SURGERIES     WISDOM TOOTH EXTRACTION      Social History   Socioeconomic History   Marital status: Married    Spouse name: Not on file   Number of children: 3   Years of education: Not on file   Highest education level: Associate degree: occupational, scientist, product/process development, or vocational program  Occupational History   Not on file  Tobacco Use   Smoking status: Never   Smokeless tobacco: Never  Vaping Use   Vaping status: Never Used  Substance and Sexual Activity   Alcohol use: No   Drug use: No   Sexual activity: Yes    Birth  control/protection: None    Comment: spouse - vasectomy  Other Topics Concern   Not on file  Social History Narrative   Not on file   Social Drivers of Health   Financial Resource Strain: Low Risk  (07/25/2024)   Overall Financial Resource Strain (CARDIA)    Difficulty of Paying Living Expenses: Not very hard  Food Insecurity: No Food Insecurity (07/25/2024)   Hunger Vital Sign    Worried About Running Out of Food in the Last Year: Never true    Ran Out of Food in the Last Year: Never true  Transportation Needs: No Transportation Needs (07/25/2024)   PRAPARE - Administrator, Civil Service (Medical): No    Lack of Transportation (Non-Medical): No  Physical Activity: Insufficiently Active (07/25/2024)   Exercise Vital Sign    Days of Exercise per Week: 3 days    Minutes of Exercise per Session: 30 min  Stress: No Stress Concern Present (07/25/2024)   [employer] of Occupational Health - Occupational Stress Questionnaire    Feeling of Stress: Not at all  Social Connections: Socially Integrated (07/25/2024)   Social Connection and Isolation Panel    Frequency of Communication with Friends and Family: More than three times a week    Frequency of Social  Gatherings with Friends and Family: Once a week    Attends Religious Services: More than 4 times per year    Active Member of Clubs or Organizations: Yes    Attends Engineer, Structural: More than 4 times per year    Marital Status: Married    Family History  Problem Relation Age of Onset   Hypertension Mother    Hyperlipidemia Mother    Hyperlipidemia Father    Hypertension Father    Colon polyps Father    Irritable bowel syndrome Father    Heart attack Maternal Grandfather    Heart disease Maternal Grandfather    Cancer Paternal Grandfather        leukemia   Uterine cancer Maternal Grandmother    Colon cancer Neg Hx    Esophageal cancer Neg Hx    Stomach cancer Neg Hx    Rectal cancer Neg Hx      Health Maintenance  Topic Date Due   Hepatitis C Screening  Never done   Influenza Vaccine  12/09/2024 (Originally 04/11/2024)   Hepatitis B Vaccines 19-59 Average Risk (1 of 3 - 19+ 3-dose series) 07/25/2025 (Originally 10/04/2003)   HPV VACCINES (1 - 3-dose SCDM series) 07/25/2025 (Originally 10/04/2011)   COVID-19 Vaccine (3 - 2025-26 season) 08/10/2025 (Originally 05/12/2024)   Cervical Cancer Screening (HPV/Pap Cotest)  07/26/2026   DTaP/Tdap/Td (2 - Td or Tdap) 12/25/2027   HIV Screening  Completed   Pneumococcal Vaccine  Aged Out   Meningococcal B Vaccine  Aged Out     ----------------------------------------------------------------------------------------------------------------------------------------------------------------------------------------------------------------- Physical Exam BP 125/86 (BP Location: Left Arm, Patient Position: Sitting, Cuff Size: Large)   Pulse 90   Ht 5' 3.5 (1.613 m)   Wt 186 lb (84.4 kg)   SpO2 98%   BMI 32.43 kg/m   Physical Exam Constitutional:      Appearance: Normal appearance.  HENT:     Head: Normocephalic and atraumatic.  Eyes:     General: No scleral icterus. Cardiovascular:     Rate and Rhythm: Normal rate and regular rhythm.  Pulmonary:     Effort: Pulmonary effort is normal.     Breath sounds: Normal breath sounds.  Neurological:     Mental Status: She is alert.  Psychiatric:        Mood and Affect: Mood normal.        Behavior: Behavior normal.     ------------------------------------------------------------------------------------------------------------------------------------------------------------------------------------------------------------------- Assessment and Plan  Acute bilateral low back pain without sciatica X-rays of the lumbar spine ordered.  May be related to constipation.  No urinary symptoms at this time.  Constipation Previously with diarrhea now with constipation.  Concern for possible  inflammatory bowel disease on previous CT.  Rechecking labs.  Checking calprotectin levels.  She will continue ClearLax for now.  Generalized abdominal pain Abdominal pain overall is improved.  Updated KUB ordered as she is having continued constipation.   No orders of the defined types were placed in this encounter.   No follow-ups on file.

## 2024-07-28 NOTE — Assessment & Plan Note (Signed)
 X-rays of the lumbar spine ordered.  May be related to constipation.  No urinary symptoms at this time.

## 2024-07-30 LAB — SPECIMEN STATUS REPORT

## 2024-07-30 LAB — CALPROTECTIN, FECAL: Calprotectin, Fecal: <5 ug/g (ref 0–120)

## 2024-10-11 ENCOUNTER — Other Ambulatory Visit: Payer: Self-pay | Admitting: Family Medicine

## 2024-10-11 DIAGNOSIS — I10 Essential (primary) hypertension: Secondary | ICD-10-CM

## 2024-10-13 NOTE — Telephone Encounter (Signed)
 Pls contact pt to schedule appt for HTN with Dr. Alvia. Sending 30 day refill. Thx.
